# Patient Record
Sex: Male | Born: 1970 | Race: White | Hispanic: No | Marital: Married | Smoking: Former smoker
Health system: Southern US, Community
[De-identification: ages and names within clinical notes are randomized; demographics above are authoritative.]

## PROBLEM LIST (undated history)

## (undated) DIAGNOSIS — F411 Generalized anxiety disorder: Secondary | ICD-10-CM

## (undated) DIAGNOSIS — R51 Headache: Secondary | ICD-10-CM

## (undated) DIAGNOSIS — C801 Malignant (primary) neoplasm, unspecified: Secondary | ICD-10-CM

## (undated) DIAGNOSIS — Z87442 Personal history of urinary calculi: Secondary | ICD-10-CM

## (undated) DIAGNOSIS — M549 Dorsalgia, unspecified: Secondary | ICD-10-CM

## (undated) HISTORY — DX: Personal history of urinary calculi: Z87.442

## (undated) HISTORY — DX: Dorsalgia, unspecified: M54.9

## (undated) HISTORY — PX: PROSTATE BIOPSY: SHX241

## (undated) HISTORY — PX: HERNIA REPAIR: SHX51

## (undated) HISTORY — DX: Headache: R51

## (undated) HISTORY — DX: Generalized anxiety disorder: F41.1

---

## 1998-11-23 ENCOUNTER — Emergency Department (HOSPITAL_COMMUNITY): Admission: EM | Admit: 1998-11-23 | Discharge: 1998-11-23 | Payer: Self-pay | Admitting: Emergency Medicine

## 2002-03-10 ENCOUNTER — Encounter: Payer: Self-pay | Admitting: Emergency Medicine

## 2002-03-10 ENCOUNTER — Emergency Department (HOSPITAL_COMMUNITY): Admission: EM | Admit: 2002-03-10 | Discharge: 2002-03-10 | Payer: Self-pay | Admitting: Emergency Medicine

## 2003-08-15 ENCOUNTER — Emergency Department (HOSPITAL_COMMUNITY): Admission: EM | Admit: 2003-08-15 | Discharge: 2003-08-15 | Payer: Self-pay | Admitting: Emergency Medicine

## 2004-11-05 ENCOUNTER — Ambulatory Visit: Payer: Self-pay | Admitting: Internal Medicine

## 2005-08-06 ENCOUNTER — Ambulatory Visit: Payer: Self-pay | Admitting: Internal Medicine

## 2005-12-10 ENCOUNTER — Ambulatory Visit: Payer: Self-pay | Admitting: Internal Medicine

## 2005-12-11 ENCOUNTER — Ambulatory Visit: Payer: Self-pay | Admitting: Gastroenterology

## 2006-08-17 ENCOUNTER — Ambulatory Visit: Payer: Self-pay | Admitting: Internal Medicine

## 2007-03-31 ENCOUNTER — Telehealth: Payer: Self-pay | Admitting: Internal Medicine

## 2007-07-15 ENCOUNTER — Ambulatory Visit: Payer: Self-pay | Admitting: Internal Medicine

## 2007-07-15 DIAGNOSIS — R51 Headache: Secondary | ICD-10-CM | POA: Insufficient documentation

## 2007-07-15 DIAGNOSIS — F411 Generalized anxiety disorder: Secondary | ICD-10-CM

## 2007-07-15 DIAGNOSIS — Z87442 Personal history of urinary calculi: Secondary | ICD-10-CM

## 2007-07-15 DIAGNOSIS — R519 Headache, unspecified: Secondary | ICD-10-CM | POA: Insufficient documentation

## 2007-07-15 HISTORY — DX: Generalized anxiety disorder: F41.1

## 2007-07-15 HISTORY — DX: Personal history of urinary calculi: Z87.442

## 2007-07-15 HISTORY — DX: Headache: R51

## 2008-07-25 ENCOUNTER — Emergency Department (HOSPITAL_COMMUNITY): Admission: EM | Admit: 2008-07-25 | Discharge: 2008-07-25 | Payer: Self-pay | Admitting: Family Medicine

## 2009-10-14 ENCOUNTER — Ambulatory Visit: Payer: Self-pay | Admitting: Internal Medicine

## 2009-11-26 ENCOUNTER — Ambulatory Visit: Payer: Self-pay | Admitting: Family Medicine

## 2010-01-07 ENCOUNTER — Ambulatory Visit: Payer: Self-pay | Admitting: Internal Medicine

## 2010-01-10 ENCOUNTER — Ambulatory Visit (HOSPITAL_COMMUNITY): Admission: RE | Admit: 2010-01-10 | Discharge: 2010-01-10 | Payer: Self-pay | Admitting: Family Medicine

## 2010-02-14 ENCOUNTER — Ambulatory Visit: Payer: Self-pay | Admitting: Adult Health

## 2010-02-17 ENCOUNTER — Encounter (INDEPENDENT_AMBULATORY_CARE_PROVIDER_SITE_OTHER): Payer: Self-pay | Admitting: Adult Health

## 2010-02-17 LAB — CONVERTED CEMR LAB
Barbiturate Quant, Ur: NEGATIVE
Benzodiazepines.: NEGATIVE
Cocaine Metabolites: NEGATIVE
Methadone: NEGATIVE
Phencyclidine (PCP): NEGATIVE
Propoxyphene: NEGATIVE

## 2010-06-27 ENCOUNTER — Encounter: Payer: Self-pay | Admitting: Internal Medicine

## 2010-07-04 ENCOUNTER — Ambulatory Visit: Payer: Self-pay | Admitting: Internal Medicine

## 2010-07-10 ENCOUNTER — Encounter: Payer: Self-pay | Admitting: Internal Medicine

## 2010-07-10 DIAGNOSIS — M549 Dorsalgia, unspecified: Secondary | ICD-10-CM

## 2010-07-10 HISTORY — DX: Dorsalgia, unspecified: M54.9

## 2010-07-11 ENCOUNTER — Encounter: Payer: Self-pay | Admitting: Internal Medicine

## 2010-07-29 ENCOUNTER — Telehealth: Payer: Self-pay | Admitting: Internal Medicine

## 2010-08-21 NOTE — Letter (Signed)
Summary: Patient Information form  Patient Information form   Imported By: Maryln Gottron 07/25/2010 09:14:43  _____________________________________________________________________  External Attachment:    Type:   Image     Comment:   External Document

## 2010-08-21 NOTE — Assessment & Plan Note (Signed)
Summary: cpx---fast 8 hours//ccm pt rsc/njr   Vital Signs:  Patient profile:   40 year old male Height:      71 inches Weight:      212 pounds BMI:     29.67 Temp:     98.7 degrees F oral BP sitting:   110 / 70  (right arm) Cuff size:   regular  Vitals Entered By: Duard Brady LPN (July 10, 2010 2:51 PM) CC: c/o back pain , stomach bloating Is Patient Diabetic? No   CC:  c/o back pain  and stomach bloating.  History of Present Illness:  40 year old patient who is seen today after a 3-year absence.  He has a history of chronic low back pain, as well as anxiety disorder.  He lost his job a few years ago, and until recently was followed at [facility] .   presently, he is having low back pain, as well as episodic anxiety.  He is now unemployed and has had health insurance as of the beginning of this month  Preventive Screening-Counseling & Management  Alcohol-Tobacco     Smoking Status: never  Allergies (verified): No Known Drug Allergies  Past History:  Past Medical History: Anxiety Headache Nephrolithiasis, hx of low back pain  Past Surgical History: Reviewed history from 07/15/2007 and no changes required. Inguinal herniorrhaphy  age 49  Family History: Reviewed history from 07/15/2007 and no changes required. both parents are in their mid 57s in good health  Two sisters also in good health.  No family history of cancer or premature heart disease  Social History: Reviewed history from 07/15/2007 and no changes required. 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  The patient denies anorexia, fever, weight loss, decreased hearing, hoarseness, chest pain, syncope, dyspnea on exertion, peripheral edema, prolonged cough, headaches, hemoptysis, abdominal pain, melena, hematochezia, severe indigestion/heartburn, hematuria, incontinence, genital sores,  muscle weakness, suspicious skin lesions, transient blindness, difficulty walking, depression, unusual weight change, abnormal bleeding, enlarged lymph nodes, angioedema, breast masses, and testicular masses.    Physical Exam  General:  overweight-appearing.  low-normal blood pressureoverweight-appearing.   Head:  Normocephalic and atraumatic without obvious abnormalities. No apparent alopecia or balding. Eyes:  No corneal or conjunctival inflammation noted. EOMI. Perrla. Funduscopic exam benign, without hemorrhages, exudates or papilledema. Vision grossly normal. Mouth:  Oral mucosa and oropharynx without lesions or exudates.  Teeth in good repair. Neck:  No deformities, masses, or tenderness noted. Chest Wall:  No deformities, masses, tenderness or gynecomastia noted. Lungs:  Normal respiratory effort, chest expands symmetrically. Lungs are clear to auscultation, no crackles or wheezes. Heart:  Normal rate and regular rhythm. S1 and S2 normal without gallop, murmur, click, rub or other extra sounds. Abdomen:  Bowel sounds positive,abdomen soft and non-tender without masses, organomegaly or hernias noted. Msk:  No deformity or scoliosis noted of thoracic or lumbar spine.    negative straight leg test Pulses:  R and L carotid,radial,femoral,dorsalis pedis and posterior tibial pulses are full and equal bilaterally Extremities:  No clubbing, cyanosis, edema, or deformity noted with normal full range of motion of all joints.   Neurologic:  alert & oriented X3, cranial nerves II-XII intact, gait normal, and DTRs symmetrical and normal.    no motor weaknessalert & oriented X3, cranial nerves II-XII intact, gait normal, and DTRs symmetrical and normal.   Skin:  Intact without suspicious  lesions or rashes Cervical Nodes:  No lymphadenopathy noted Axillary Nodes:  No palpable lymphadenopathy Inguinal Nodes:  No significant adenopathy Psych:  Cognition and judgment appear intact. Alert and  cooperative with normal attention span and concentration. No apparent delusions, illusions, hallucinations   Impression & Recommendations:  Problem # 1:  Preventive Health Care (ICD-V70.0)  Orders: Venipuncture (16109) T- * Misc. Laboratory test [phone])  Complete Medication List: 1)  Vicodin 5-500 Mg Tabs (Hydrocodone-acetaminophen) .Marland Kitchen.. 1 q6h as needed 2)  Alprazolam Xr 0.5 Mg Tb24 (Alprazolam) .... One twice daily as needed  Other Orders: Depo- Medrol 80mg  (J1040) Admin of Therapeutic Inj  intramuscular or subcutaneous (09811)  Patient Instructions: 1)  Please schedule a follow-up appointment in 6 months. 2)  Limit your Sodium (Salt). 3)  It is important that you exercise regularly at least 20 minutes 5 times a week. If you develop chest pain, have severe difficulty breathing, or feel very tired , stop exercising immediately and seek medical attention. 4)  You need to lose weight. Consider a lower calorie diet and regular exercise.  5)  Take 400-600mg  of Ibuprofen (Advil, Motrin) with food every 4-6 hours as needed for relief of pain or comfort of fever. Prescriptions: ALPRAZOLAM XR 0.5 MG  TB24 (ALPRAZOLAM) one twice daily as needed  #50 x 4   Entered and Authorized by:   Gordy Savers  MD   Signed by:   Gordy Savers  MD on 07/10/2010   Method used:   Print then Give to Patient   RxID:   [id number] VICODIN 5-500 MG  TABS (HYDROCODONE-ACETAMINOPHEN) 1 q6h as needed  #50 x 2   Entered and Authorized by:   Gordy Savers  MD   Signed by:   Gordy Savers  MD on 07/10/2010   Method used:   Print then Give to Patient   RxID:   [id number]    Medication Administration  Injection # 1:    Medication: Depo- Medrol 80mg     Diagnosis: BACK PAIN, RIGHT (ICD-724.5)    Route: IM    Site: RUOQ gluteus    Exp Date: 10/18/2012    Lot #: [id number]    Mfr: Pharmacia    Patient tolerated injection without complications    Given by: Romualdo Bolk,  CMA (AAMA) (July 10, 2010 3:46 PM)  Orders Added: 1)  Est. Patient 40-64 years [99396] 2)  Est. Patient 40-64 years [99396] 3)  Venipuncture [40102] 4)  T- * Misc. Laboratory test [phone] 5)  Depo- Medrol 80mg  [J1040] 6)  Admin of Therapeutic Inj  intramuscular or subcutaneous [64403]

## 2010-08-21 NOTE — Progress Notes (Signed)
Summary: lab results  Phone Note Call from Patient Call back at Home Phone [phone] Call back at [phone]   Caller: Patient Call For: Gordy Savers  MD Summary of Call: pt needs varicella results Initial call taken by: Heron Sabins,  July 29, 2010 12:36 PM  Follow-up for Phone Call        please mail copy and notify positive (immunity ) Follow-up by: Gordy Savers  MD,  July 29, 2010 1:11 PM  Additional Follow-up for Phone Call Additional follow up Details #1::        hm# does not accept incoming calls - called other 3listed in msg - VM - LMTCB - test positive for immunity - call back with address if I need to mail a copy of results. KIK Additional Follow-up by: Duard Brady LPN,  July 29, 2010 1:34 PM

## 2010-08-21 NOTE — Miscellaneous (Signed)
Summary: cintrolled substance contract  Medications Added VICODIN 5-500 MG  TABS (HYDROCODONE-ACETAMINOPHEN) 1 q6h as needed ALPRAZOLAM XR 0.5 MG  TB24 (ALPRAZOLAM) one twice daily as needed       Clinical Lists Changes  Medications: Changed medication from VICODIN 5-500 MG  TABS (HYDROCODONE-ACETAMINOPHEN) 1 q6h as needed to VICODIN 5-500 MG  TABS (HYDROCODONE-ACETAMINOPHEN) 1 q6h as needed Changed medication from ALPRAZOLAM XR 0.5 MG  TB24 (ALPRAZOLAM) one twice daily as needed to ALPRAZOLAM XR 0.5 MG  TB24 (ALPRAZOLAM) one twice daily as needed

## 2010-08-21 NOTE — Miscellaneous (Signed)
Summary: Controlled Substances Contract  Controlled Substances Contract   Imported By: Maryln Gottron 07/17/2010 14:21:49  _____________________________________________________________________  External Attachment:    Type:   Image     Comment:   External Document

## 2010-08-21 NOTE — Letter (Signed)
Summary: [facility]  [facility]   Imported By: Maryln Gottron 08/08/2010 13:23:49  _____________________________________________________________________  External Attachment:    Type:   Image     Comment:   External Document

## 2010-11-03 LAB — COMPREHENSIVE METABOLIC PANEL
ALT: 42 U/L (ref 0–53)
AST: 26 U/L (ref 0–37)
Albumin: 4.2 g/dL (ref 3.5–5.2)
Alkaline Phosphatase: 75 U/L (ref 39–117)
CO2: 28 mEq/L (ref 19–32)
Chloride: 104 mEq/L (ref 96–112)
Creatinine, Ser: 1.06 mg/dL (ref 0.4–1.5)
GFR calc Af Amer: >60 mL/min (ref >60)
Glucose, Bld: 104 mg/dL — ABNORMAL HIGH (ref 70–99)
Potassium: 3.9 mEq/L (ref 3.5–5.1)
Sodium: 139 mEq/L (ref 135–145)
Total Protein: 6.9 g/dL (ref 6.0–8.3)

## 2010-11-03 LAB — CBC
Hemoglobin: 15.5 g/dL (ref 13.0–17.0)
WBC: 5.9 10*3/uL (ref 4.0–10.5)

## 2010-11-03 LAB — DIFFERENTIAL
Basophils Absolute: 0.1 10*3/uL (ref 0.0–0.1)
Monocytes Absolute: 0.3 10*3/uL (ref 0.1–1.0)
Neutro Abs: 3.8 10*3/uL (ref 1.7–7.7)

## 2010-11-03 LAB — LIPASE, BLOOD: Lipase: 51 U/L (ref 11–59)

## 2011-01-07 ENCOUNTER — Encounter: Payer: Self-pay | Admitting: Internal Medicine

## 2011-01-08 ENCOUNTER — Ambulatory Visit (INDEPENDENT_AMBULATORY_CARE_PROVIDER_SITE_OTHER): Payer: BC Managed Care – PPO | Admitting: Internal Medicine

## 2011-01-08 ENCOUNTER — Encounter: Payer: Self-pay | Admitting: Internal Medicine

## 2011-01-08 DIAGNOSIS — M549 Dorsalgia, unspecified: Secondary | ICD-10-CM

## 2011-01-08 DIAGNOSIS — M545 Low back pain: Secondary | ICD-10-CM

## 2011-01-08 DIAGNOSIS — R1030 Lower abdominal pain, unspecified: Secondary | ICD-10-CM

## 2011-01-08 DIAGNOSIS — R109 Unspecified abdominal pain: Secondary | ICD-10-CM

## 2011-01-08 DIAGNOSIS — Z87442 Personal history of urinary calculi: Secondary | ICD-10-CM

## 2011-01-08 LAB — POCT URINALYSIS DIPSTICK
Bilirubin, UA: NEGATIVE
Glucose, UA: NEGATIVE

## 2011-01-08 NOTE — Progress Notes (Signed)
  Subjective:    Patient ID: Justin Norris, male    DOB: [birth year], 40 y.o.   MRN: [id number]  HPI  40 year old patient who has a history of both nephrolithiasis and also chronic back pain. He presents today complaining of worsening pain mainly in the right midabdominal area this is associated with the sense of bloating. He also describes discomfort in the right flank region. He describes some slight decreased urinary stream. Denies any frank gross hematuria. He has been on narcotic analgesics in the past. He states he is not taking much the way of pain medications and he does not answer any refills today   Review of Systems  Constitutional: Negative for fever, chills, appetite change and fatigue.  HENT: Negative for hearing loss, ear pain, congestion, sore throat, trouble swallowing, neck stiffness, dental problem, voice change and tinnitus.   Eyes: Negative for pain, discharge and visual disturbance.  Respiratory: Negative for cough, chest tightness, wheezing and stridor.   Cardiovascular: Negative for chest pain, palpitations and leg swelling.  Gastrointestinal: Positive for abdominal pain and abdominal distention. Negative for nausea, vomiting, diarrhea, constipation and blood in stool.  Genitourinary: Positive for decreased urine volume. Negative for urgency, hematuria, flank pain, discharge, difficulty urinating and genital sores.  Musculoskeletal: Negative for myalgias, back pain, joint swelling, arthralgias and gait problem.  Skin: Negative for rash.  Neurological: Negative for dizziness, syncope, speech difficulty, weakness, numbness and headaches.  Hematological: Negative for adenopathy. Does not bruise/bleed easily.  Psychiatric/Behavioral: Negative for behavioral problems and dysphoric mood. The patient is not nervous/anxious.        Objective:   Physical Exam  Constitutional: He is oriented to person, place, and time. He appears well-developed and well-nourished. No distress.    HENT:  Head: Normocephalic.  Right Ear: External ear normal.  Left Ear: External ear normal.  Eyes: Conjunctivae and EOM are normal.  Neck: Normal range of motion.  Cardiovascular: Normal rate and normal heart sounds.   Pulmonary/Chest: Breath sounds normal.  Abdominal: Soft. Bowel sounds are normal. He exhibits no distension and no mass. There is no tenderness. There is no rebound and no guarding.       Mildly obese soft nontender  Musculoskeletal: Normal range of motion. He exhibits no edema and no tenderness.  Neurological: He is alert and oriented to person, place, and time.  Psychiatric: He has a normal mood and affect. His behavior is normal.          Assessment & Plan:  Abdominal and back pain history of nephrolithiasis. Trace hematuria. Will obtain a CT urogram to rule out renal stone hydro-nephrosis

## 2011-01-08 NOTE — Patient Instructions (Signed)
CT abdomen as discussed push fluids  Call or return to clinic prn if these symptoms worsen or fail to improve as anticipated.

## 2011-01-09 ENCOUNTER — Other Ambulatory Visit: Payer: Self-pay

## 2011-01-09 MED ORDER — ALPRAZOLAM ER 0.5 MG PO TB24: 0.5000 mg | ORAL_TABLET | Freq: Two times a day (BID) | ORAL | Status: DC | PRN

## 2011-01-09 NOTE — Telephone Encounter (Signed)
Faxed back to [facility].

## 2011-01-14 ENCOUNTER — Ambulatory Visit (INDEPENDENT_AMBULATORY_CARE_PROVIDER_SITE_OTHER)
Admission: RE | Admit: 2011-01-14 | Discharge: 2011-01-14 | Disposition: A | Discharge status: Home / Self Care | Payer: BC Managed Care – PPO | Source: Ambulatory Visit | Attending: Internal Medicine | Admitting: Internal Medicine

## 2011-01-14 DIAGNOSIS — Z87442 Personal history of urinary calculi: Secondary | ICD-10-CM

## 2011-01-14 DIAGNOSIS — M549 Dorsalgia, unspecified: Secondary | ICD-10-CM

## 2011-01-15 ENCOUNTER — Telehealth: Payer: Self-pay

## 2011-01-15 MED ORDER — TRAMADOL HCL 50 MG PO TABS: 50.0000 mg | ORAL_TABLET | Freq: Four times a day (QID) | ORAL | Status: DC | PRN

## 2011-01-15 NOTE — Telephone Encounter (Signed)
Pt aware of this. Rx sent to pharmacy.

## 2011-01-15 NOTE — Telephone Encounter (Signed)
spke with pt - informed of xray normal - ned. , pt still with moderate pain - what next ?

## 2011-01-15 NOTE — Telephone Encounter (Signed)
Please call in a prescription of tramadol 50 mg #90 for pain 1 every 6 hours when necessary. 2 refills

## 2011-01-15 NOTE — Progress Notes (Signed)
Quick Note:  Spoke with pt -informed of xray results - normal - Pt states still having pain - what next? ______

## 2011-04-06 ENCOUNTER — Ambulatory Visit (INDEPENDENT_AMBULATORY_CARE_PROVIDER_SITE_OTHER): Payer: BC Managed Care – PPO | Admitting: Internal Medicine

## 2011-04-06 ENCOUNTER — Encounter: Payer: Self-pay | Admitting: Internal Medicine

## 2011-04-06 DIAGNOSIS — L509 Urticaria, unspecified: Secondary | ICD-10-CM

## 2011-04-06 MED ORDER — METHYLPREDNISOLONE ACETATE 40 MG/ML IJ SUSP
40.0000 mg | Freq: Once | INTRAMUSCULAR | Status: AC
  Administered 2011-04-06: 40 mg via INTRAMUSCULAR

## 2011-04-06 MED ORDER — METHYLPREDNISOLONE ACETATE 80 MG/ML IJ SUSP
80.0000 mg | Freq: Once | INTRAMUSCULAR | Status: AC
  Administered 2011-04-06: 80 mg via INTRAMUSCULAR

## 2011-04-06 NOTE — Progress Notes (Signed)
Addended by: Duard Brady I on: 04/06/2011 11:56 AM   Modules accepted: Orders

## 2011-04-06 NOTE — Patient Instructions (Signed)
Consider a once a day  nonsedating antihistamine such as Alavert or Allegra  Call or return to clinic prn if these symptoms worsen or fail to improve as anticipated.

## 2011-04-06 NOTE — Progress Notes (Signed)
  Subjective:    Patient ID: Justin Norris, male    DOB: [birth year], 40 y.o.   MRN: [id number]  HPI  40 year old patient who presents with a couple day history of fairly generalized urticaria. This spares the face but the involved extremity and trunk. He has been traveling out of town and has had some possible exposure here locally as well. When traveling out of state he did not have his travel back and was using until silken shampoos etc. no prior history of hives. No pulmonary symptoms.   Review of Systems  Skin: Positive for rash.       Objective:   Physical Exam  Constitutional: He appears well-developed and well-nourished. No distress.  Pulmonary/Chest: Effort normal and breath sounds normal. No respiratory distress. He has no wheezes.  Skin: Rash noted.       Rather diffuse urticarial lesions on extremities and trunk          Assessment & Plan:   Urticaria. Will treat with Depo-Medrol. A nonsedating antihistamines also encouraged as well as Benadryl at bedtime. Will call if not improved

## 2011-04-17 ENCOUNTER — Telehealth: Payer: Self-pay | Admitting: *Deleted

## 2011-04-17 NOTE — Telephone Encounter (Addendum)
[place]  Suggested to get OTC antihistamine, and gave him signs that he should look for if severe allergic reaction.

## 2011-04-20 NOTE — Telephone Encounter (Signed)
Dr. K aware. 

## 2011-05-22 IMAGING — CT CT PARANASAL SINUSES LIMITED
4 of 5 series · 14 of 33 positions shown, 16 images · non-contrast
Comparison: None.

CLINICAL DATA: Right sided facial pain

CT PARANASAL SINUS LIMITED WITHOUT CONTRAST
TECHNIQUE: Multidetector CT images of the paranasal sinuses were
obtained in a single plane without contrast.

[Series 2: neck st · axial · 0.47mm/px · z∈[-229,-85]mm · 3 of 96 slices shown]
[im 24/96  bone]
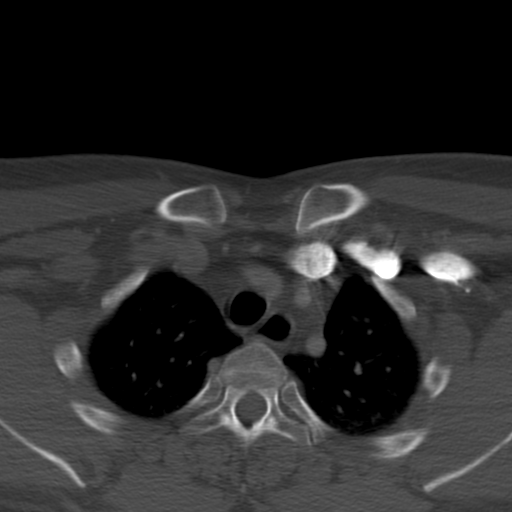
[im 48/96  bone]
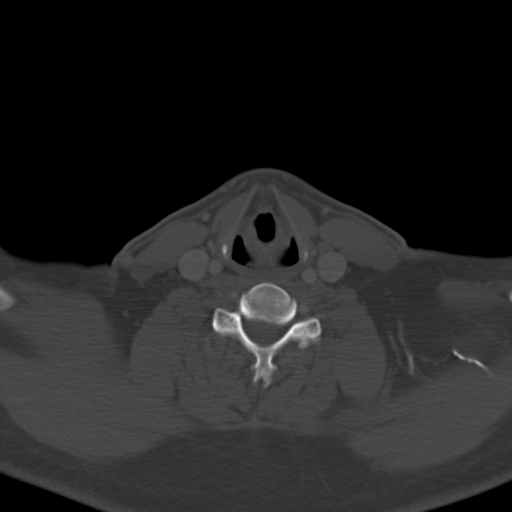
[im 72/96  bone]
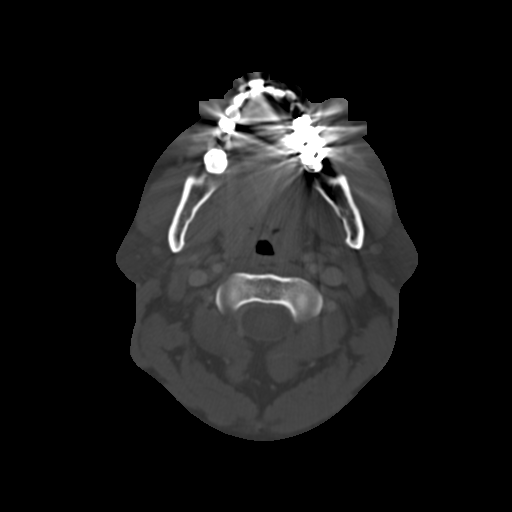

[Series 603: <mpr thick range(1)> · sagittal · 0.56mm/px · 3 of 80 slices shown]
[im 33/80  bone]
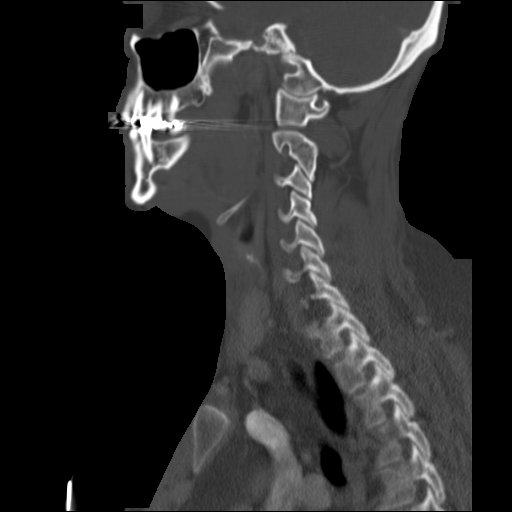
[im 40/80  bone]
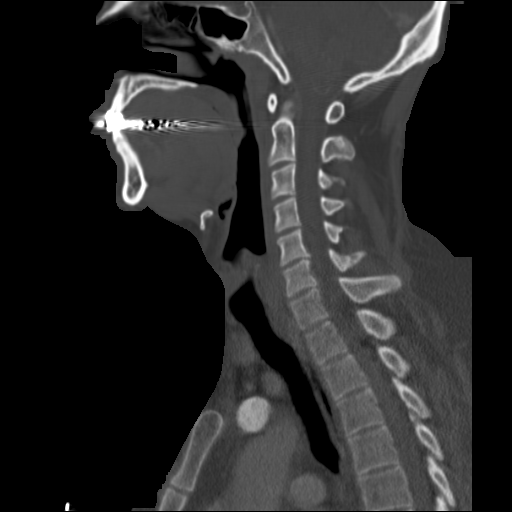
[im 47/80  bone]
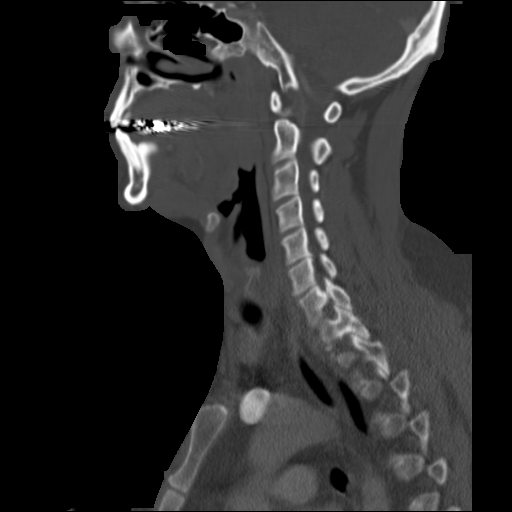

[Series 604: <mpr thick range(2)> · coronal · 0.56mm/px · 1 of 81 slices shown]
[im 41/81  bone]
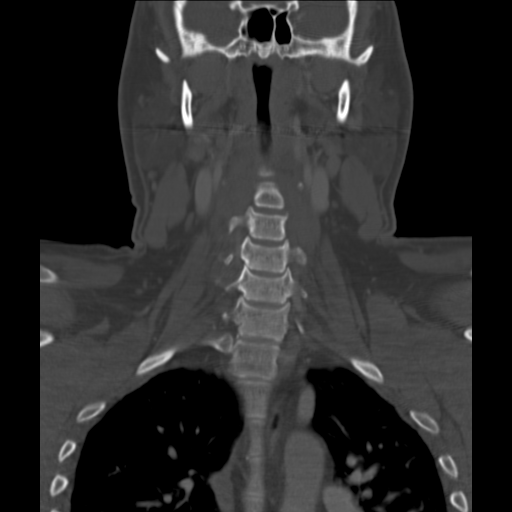

[Series 605: <mpr thick range(3)> · axial · 0.56mm/px · z∈[-327,-89]mm · 7 of 168 slices shown, 9 images]
[im 21/168  soft-tissue]
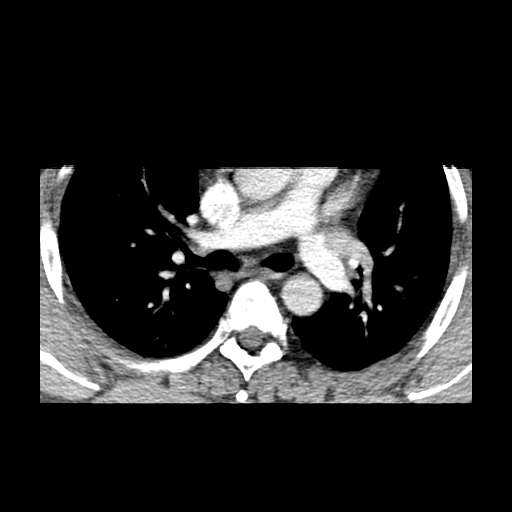
[im 21/168  bone]
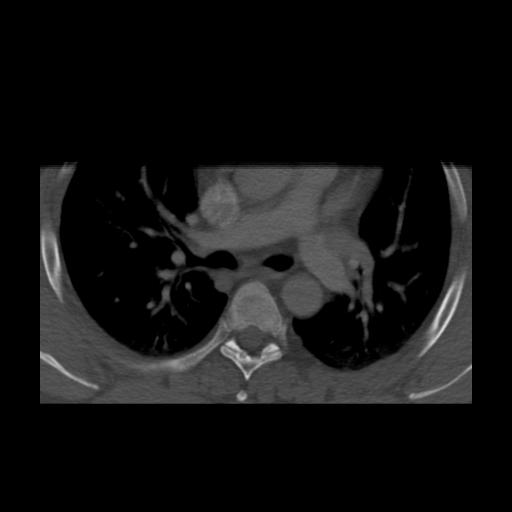
[im 42/168  bone]
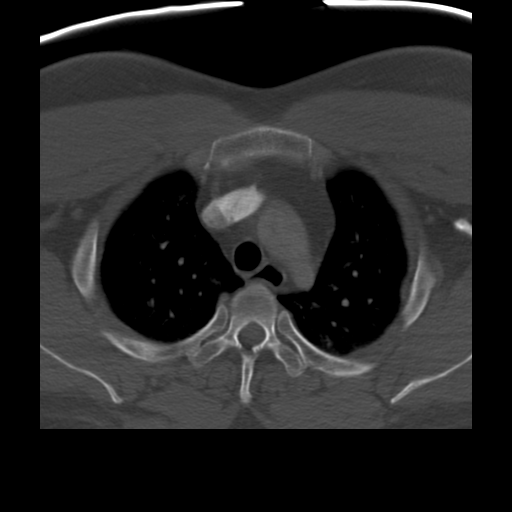
[im 63/168  bone]
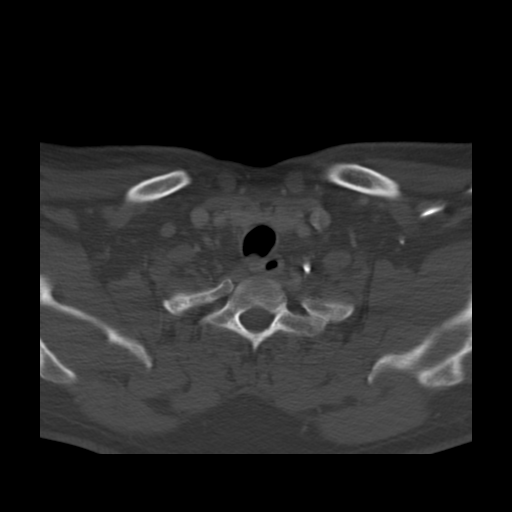
[im 84/168  bone]
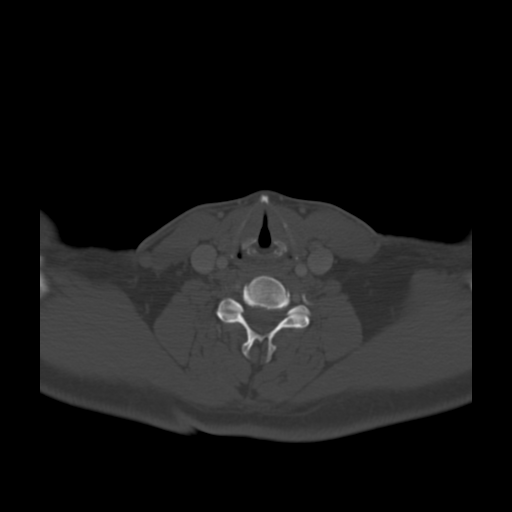
[im 105/168  soft-tissue]
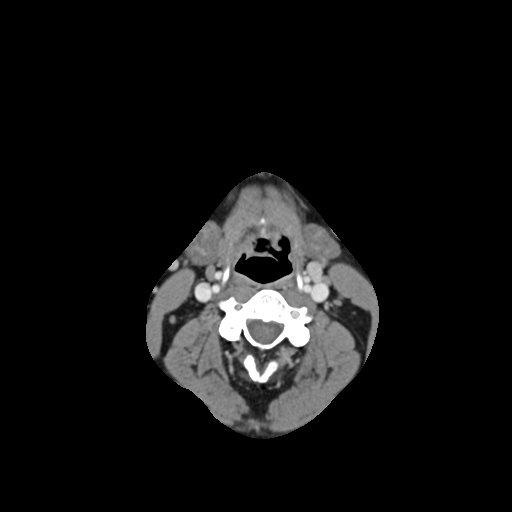
[im 105/168  bone]
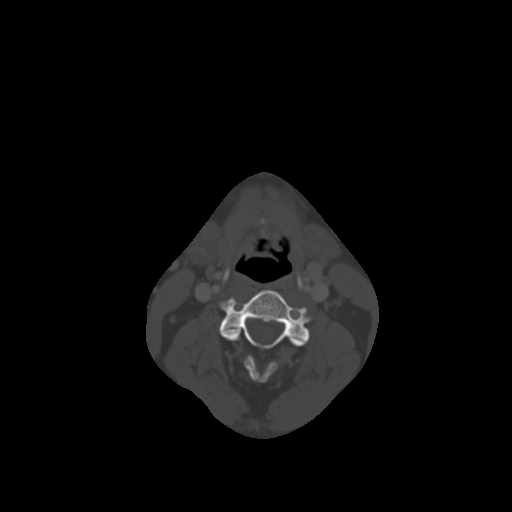
[im 126/168  bone]
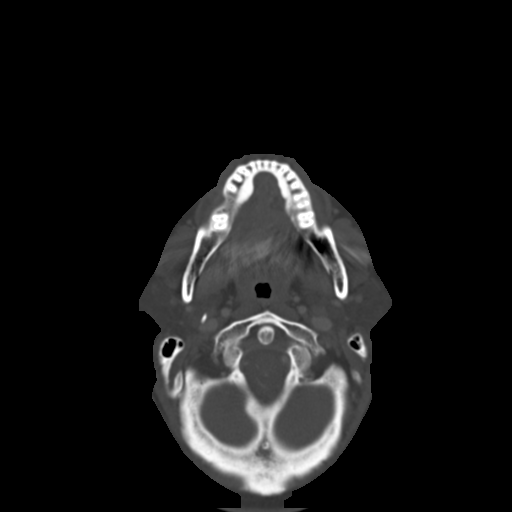
[im 147/168  bone]
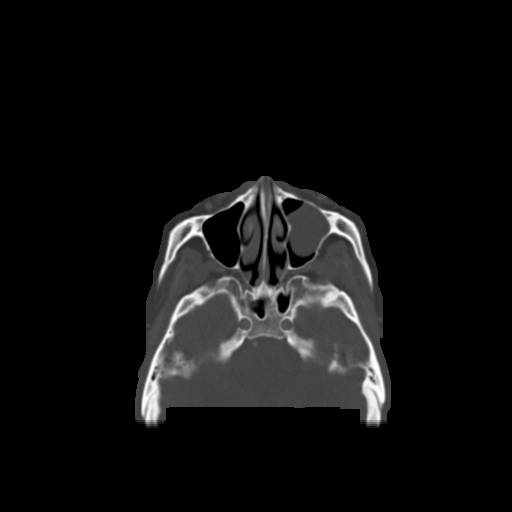

[14 of 33 positions shown; findings below may reference images not displayed]

FINDINGS: There is a large retention cyst within the left maxillary
sinus.  However no air-fluid level is seen within any of the
paranasal sinuses and no significant mucosal thickening is seen.
The right nasal airway is patent.  The left nasal airway is
slightly compromised by prominent turbinates.  No bony abnormality
is seen.
IMPRESSION: No definite sinusitis.  There is a large retention cyst however
filling much of the left maxillary sinus.  Slightly compromised
left nasal airway as noted above.

## 2011-06-16 ENCOUNTER — Other Ambulatory Visit: Payer: Self-pay | Admitting: Internal Medicine

## 2011-06-16 NOTE — Telephone Encounter (Signed)
Refill Alprazolam---[facility]  [address]. thanks

## 2011-06-17 MED ORDER — ALPRAZOLAM ER 0.5 MG PO TB24: 0.5000 mg | ORAL_TABLET | Freq: Two times a day (BID) | ORAL | Status: DC | PRN

## 2011-10-02 ENCOUNTER — Encounter: Payer: Self-pay | Admitting: Internal Medicine

## 2011-10-02 ENCOUNTER — Ambulatory Visit (INDEPENDENT_AMBULATORY_CARE_PROVIDER_SITE_OTHER): Payer: BC Managed Care – PPO | Admitting: Internal Medicine

## 2011-10-02 VITALS — BP 130/80 | Wt 212.0 lb

## 2011-10-02 DIAGNOSIS — M549 Dorsalgia, unspecified: Secondary | ICD-10-CM

## 2011-10-02 MED ORDER — ALPRAZOLAM ER 0.5 MG PO TB24: 0.5000 mg | ORAL_TABLET | Freq: Two times a day (BID) | ORAL | Status: DC | PRN

## 2011-10-02 NOTE — Progress Notes (Signed)
  Subjective:    Patient ID: Justin Norris, male    DOB: 1971/01/26, 41 y.o.   MRN: [id number]  HPI A 41 year old patient who is seen today complaining of right back pain with some radiation down the right leg. This has been an intermittent problem over the years he usually responds quite nicely to a cortisone injection. He has had no injections over the past 6 months. No motor weakness.   Review of Systems  Musculoskeletal: Positive for back pain.       Objective:   Physical Exam  Musculoskeletal: Normal range of motion. He exhibits no edema and no tenderness.       Negative straight leg test Motor examination normal Normal flexion and extension of the ankle Achilles reflexes blunted but symmetrical          Assessment & Plan:   Flare low back pain. Will treat with Depo-Medrol. We'll continue anti-inflammatories rest. Will call if unimproved

## 2011-10-02 NOTE — Patient Instructions (Signed)
Most patients with low back pain will improve with time over the next two to 6 weeks.  Keep active but avoid any activities that cause pain.  Apply moist heat to the low back area several times daily.  Call or return to clinic prn if these symptoms worsen or fail to improve as anticipated.  

## 2011-10-13 ENCOUNTER — Telehealth: Payer: Self-pay

## 2011-10-13 NOTE — Telephone Encounter (Signed)
Pt states he was seen on last week for lower back pain.  This week pt is experiencing pains behind his belly button that feel like "cracked glass".  Pt states if he touches the 2 inch circle around his belly button it is very painful.  Pt is also experiencing nausea, sweats, and feels a little feverish.  Pt states he only had 1 meal on yesterday and he feels "blocked".  Per pt it is getting difficult to end over to pick up things.  Pt states he does not believe this is gas or constipation.  Pt has a BM every day but describes it as slow, hard pebbles the size of a nickel..  Pt is currently out of town working and will not be back until Thursday.  Pls advise.

## 2011-10-13 NOTE — Telephone Encounter (Signed)
Suggest urgent care for pain worsens while out of town. Otherwise we will see on Thursday. Please schedule appointment

## 2011-10-14 NOTE — Telephone Encounter (Signed)
Pt is aware.  Appt made for 10/12/11 at 4: 15 pm.

## 2011-10-15 ENCOUNTER — Ambulatory Visit: Payer: BC Managed Care – PPO | Admitting: Internal Medicine

## 2011-10-23 ENCOUNTER — Ambulatory Visit (INDEPENDENT_AMBULATORY_CARE_PROVIDER_SITE_OTHER): Payer: BC Managed Care – PPO | Admitting: Internal Medicine

## 2011-10-23 ENCOUNTER — Encounter: Payer: Self-pay | Admitting: Internal Medicine

## 2011-10-23 VITALS — BP 120/80 | Temp 98.0°F | Wt 209.0 lb

## 2011-10-23 DIAGNOSIS — K429 Umbilical hernia without obstruction or gangrene: Secondary | ICD-10-CM

## 2011-10-23 DIAGNOSIS — F411 Generalized anxiety disorder: Secondary | ICD-10-CM

## 2011-10-23 MED ORDER — ALPRAZOLAM 0.5 MG PO TABS: 0.5000 mg | ORAL_TABLET | Freq: Two times a day (BID) | ORAL | Status: DC | PRN

## 2011-10-23 MED ORDER — HYDROCODONE-ACETAMINOPHEN 5-500 MG PO TABS: 1.0000 | ORAL_TABLET | Freq: Four times a day (QID) | ORAL | Status: DC | PRN

## 2011-10-23 NOTE — Progress Notes (Signed)
  Subjective:    Patient ID: Justin Norris, male    DOB: 12-06-1970, 41 y.o.   MRN: [id number]  HPI  41 year old patient who presents with a several week history of intermittent periumbilical pain. At times he notes a bulge in the umbilical area that he is able to reduce he states that time pain is quite painful. His job does require intermittent heavy lifting that tends to aggravate the pain.  Wt Readings from Last 3 Encounters:  10/23/11 209 lb (94.802 kg)  10/02/11 212 lb (96.163 kg)  04/06/11 215 lb (97.523 kg)    Review of Systems  Gastrointestinal: Positive for abdominal pain.  Psychiatric/Behavioral: The patient is nervous/anxious.        Objective:   Physical Exam  Constitutional: He appears well-developed and well-nourished. No distress.  Abdominal:       No obvious umbilical hernia. However there does appear to be small defect in the umbilical region Palpation about the umbilical area revealed mild tenderness          Assessment & Plan:   Probable small umbilical hernia. He was advised to use an abdominal binder. He will call for a surgical referral if pain worsens.

## 2011-10-23 NOTE — Patient Instructions (Signed)
Use an abdominal binder as discussed  Call for surgical referral if  pain continues to be a problem

## 2011-10-29 ENCOUNTER — Telehealth: Payer: Self-pay | Admitting: *Deleted

## 2011-10-29 DIAGNOSIS — R1033 Periumbilical pain: Secondary | ICD-10-CM

## 2011-10-29 NOTE — Telephone Encounter (Signed)
Spoke with pt- dr. Amador Cunas has left info in last office note that if worse - surgical referral - I will put order in and sent to terri to schedule. Pt indicated because he works out of town - can only be seen on friday's

## 2011-10-29 NOTE — Telephone Encounter (Signed)
Pt is asking if we can move his appt up with the surgeon for his umbilical hernia.  He is having increasing pain after doing strenuous labor.

## 2011-11-23 ENCOUNTER — Ambulatory Visit (INDEPENDENT_AMBULATORY_CARE_PROVIDER_SITE_OTHER): Payer: BC Managed Care – PPO | Admitting: Surgery

## 2011-11-23 ENCOUNTER — Encounter (INDEPENDENT_AMBULATORY_CARE_PROVIDER_SITE_OTHER): Payer: Self-pay | Admitting: Surgery

## 2011-11-23 VITALS — BP 112/60 | HR 66 | Temp 96.4°F | Resp 12 | Ht 71.0 in | Wt 209.6 lb

## 2011-11-23 DIAGNOSIS — K429 Umbilical hernia without obstruction or gangrene: Secondary | ICD-10-CM | POA: Insufficient documentation

## 2011-11-23 DIAGNOSIS — K5900 Constipation, unspecified: Secondary | ICD-10-CM | POA: Insufficient documentation

## 2011-11-23 NOTE — Progress Notes (Signed)
Patient ID: Justin Norris, male   DOB: [birth year], 41 y.o.   MRN: [id number]  Chief Complaint  Patient presents with  . Abdominal Pain    new pt    HPI Justin Norris is a 41 y.o. male.   HPI Patient sent at the request of Dr. Amador Cunas due to abdominal pain. He has had it for one year. He describes the pain as sharp located run his umbilicus made worse with lifting or exertion. He also has some right lower quadrant and left lower quadrant pain and bloating. He does have some issues with constipation. He has changed jobs recently and does a lot of driving and eating fast food. He has been gaining weight. He drinks multiple caffeinated beverages. He also has pain in his lower back. As the blood in stool, nausea or vomiting.  Past Medical History  Diagnosis Date  . ANXIETY 07/15/2007  . BACK PAIN, RIGHT 07/10/2010  . Headache 07/15/2007  . NEPHROLITHIASIS, HX OF 07/15/2007    Past Surgical History  Procedure Date  . Hernia repair     [facility]    History reviewed. No pertinent family history.  Social History History  Substance Use Topics  . Smoking status: Never Smoker   . Smokeless tobacco: Never Used  . Alcohol Use: Yes     rarely    No Known Allergies  Current Outpatient Prescriptions  Medication Sig Dispense Refill  . ALPRAZolam (XANAX) 0.5 MG tablet Take 1 tablet (0.5 mg total) by mouth 2 (two) times daily as needed.  60 tablet  3    Review of Systems Review of Systems  Constitutional: Positive for unexpected weight change.  HENT: Negative.   Eyes: Negative.   Respiratory: Negative.   Cardiovascular: Negative.   Gastrointestinal: Positive for abdominal pain, constipation and abdominal distention.  Genitourinary: Negative.   Musculoskeletal: Negative.   Neurological: Negative.   Hematological: Negative.   Psychiatric/Behavioral: Negative.     Blood pressure 112/60, pulse 66, temperature 96.4 F (35.8 C), temperature source Temporal, resp. rate 12, height 5\' 11"   (1.803 m), weight 209 lb 9.6 oz (95.074 kg).  Physical Exam Physical Exam  Constitutional: He is oriented to person, place, and time. He appears well-developed and well-nourished.  HENT:  Head: Normocephalic and atraumatic.  Eyes: EOM are normal. Pupils are equal, round, and reactive to light.  Neck: Normal range of motion. Neck supple.  Cardiovascular: Normal rate and regular rhythm.   Pulmonary/Chest: Effort normal and breath sounds normal.  Abdominal: Soft. Bowel sounds are normal. He exhibits distension.    Musculoskeletal: Normal range of motion.  Neurological: He is alert and oriented to person, place, and time.  Skin: Skin is warm and dry.  Psychiatric: He has a normal mood and affect. His behavior is normal. Thought content normal.    Data Reviewed   Assessment    Umbilical hernia symptomatic  Constipation    Plan    I discussed options of repair of his umbilical hernia versus observation. I think is constipated and this is giving some abdominal discomfort. Recommend increase his water intake, increasing his dietary fiber and try MiraLax as needed to see if it helps her symptoms of abdominal pain. He will let me know if he wants his hernia repaired.       Nomie Buchberger A. 11/23/2011, 10:08 AM

## 2011-11-23 NOTE — Patient Instructions (Signed)
Hernia A hernia occurs when an internal organ pushes out through a weak spot in the abdominal wall. Hernias most commonly occur in the groin and around the navel. Hernias often can be pushed back into place (reduced). Most hernias tend to get worse over time. Some abdominal hernias can get stuck in the opening (irreducible or incarcerated hernia) and cannot be reduced. An irreducible abdominal hernia which is tightly squeezed into the opening is at risk for impaired blood supply (strangulated hernia). A strangulated hernia is a medical emergency. Because of the risk for an irreducible or strangulated hernia, surgery may be recommended to repair a hernia. CAUSES   Heavy lifting.   Prolonged coughing.   Straining to have a bowel movement.   A cut (incision) made during an abdominal surgery.  HOME CARE INSTRUCTIONS   Bed rest is not required. You may continue your normal activities.   Avoid lifting more than 10 pounds (4.5 kg) or straining.   Cough gently. If you are a smoker it is best to stop. Even the best hernia repair can break down with the continual strain of coughing. Even if you do not have your hernia repaired, a cough will continue to aggravate the problem.   Do not wear anything tight over your hernia. Do not try to keep it in with an outside bandage or truss. These can damage abdominal contents if they are trapped within the hernia sac.   Eat a normal diet.   Avoid constipation. Straining over long periods of time will increase hernia size and encourage breakdown of repairs. If you cannot do this with diet alone, stool softeners may be used.  SEEK IMMEDIATE MEDICAL CARE IF:   You have a fever.   You develop increasing abdominal pain.   You feel nauseous or vomit.   Your hernia is stuck outside the abdomen, looks discolored, feels hard, or is tender.   You have any changes in your bowel habits or in the hernia that are unusual for you.   You have increased pain or  swelling around the hernia.   You cannot push the hernia back in place by applying gentle pressure while lying down.  MAKE SURE YOU:   Understand these instructions.   Will watch your condition.   Will get help right away if you are not doing well or get worse.  Document Released: 07/06/2005 Document Revised: 06/25/2011 Document Reviewed: 02/23/2008 [facility] Patient Information 2012 [place], [place].        Constipation in Adults Constipation is having fewer than 2 bowel movements per week. Usually, the stools are hard. As we grow older, constipation is more common. If you try to fix constipation with laxatives, the problem may get worse. This is because laxatives taken over a long period of time make the colon muscles weaker. A low-fiber diet, not taking in enough fluids, and taking some medicines may make these problems worse. MEDICATIONS THAT MAY CAUSE CONSTIPATION  Water pills (diuretics).   Calcium channel blockers (used to control blood pressure and for the heart).   Certain pain medicines (narcotics).   Anticholinergics.   Anti-inflammatory agents.   Antacids that contain aluminum.  DISEASES THAT CONTRIBUTE TO CONSTIPATION  Diabetes.   Parkinson's disease.   Dementia.   Stroke.   Depression.   Illnesses that cause problems with salt and water metabolism.  HOME CARE INSTRUCTIONS   Constipation is usually best cared for without medicines. Increasing dietary fiber and eating more fruits and vegetables is the best way  to manage constipation.   Slowly increase fiber intake to 25 to 38 grams per day. Whole grains, fruits, vegetables, and legumes are good sources of fiber. A dietitian can further help you incorporate high-fiber foods into your diet.   Drink enough water and fluids to keep your urine clear or pale yellow.   A fiber supplement may be added to your diet if you cannot get enough fiber from foods.   Increasing your activities also helps improve  regularity.   Suppositories, as suggested by your caregiver, will also help. If you are using antacids, such as aluminum or calcium containing products, it will be helpful to switch to products containing magnesium if your caregiver says it is okay.   If you have been given a liquid injection (enema) today, this is only a temporary measure. It should not be relied on for treatment of longstanding (chronic) constipation.   Stronger measures, such as magnesium sulfate, should be avoided if possible. This may cause uncontrollable diarrhea. Using magnesium sulfate may not allow you time to make it to the bathroom.  SEEK IMMEDIATE MEDICAL CARE IF:   There is bright red blood in the stool.   The constipation stays for more than 4 days.   There is belly (abdominal) or rectal pain.   You do not seem to be getting better.   You have any questions or concerns.  MAKE SURE YOU:   Understand these instructions.   Will watch your condition.   Will get help right away if you are not doing well or get worse.  Document Released: 04/03/2004 Document Revised: 06/25/2011 Document Reviewed: 06/09/2011 [facility] Patient Information 2012 [place], [place].     Miralax 17 grams a day.  This is over the counter.  Citrucel fiber supplement daily.   64 ounces of water daily.

## 2012-02-09 ENCOUNTER — Encounter: Payer: Self-pay | Admitting: Internal Medicine

## 2012-02-09 ENCOUNTER — Ambulatory Visit (INDEPENDENT_AMBULATORY_CARE_PROVIDER_SITE_OTHER): Payer: BC Managed Care – PPO | Admitting: Internal Medicine

## 2012-02-09 VITALS — BP 116/80 | Temp 98.5°F | Wt 204.0 lb

## 2012-02-09 DIAGNOSIS — K429 Umbilical hernia without obstruction or gangrene: Secondary | ICD-10-CM

## 2012-02-09 DIAGNOSIS — F411 Generalized anxiety disorder: Secondary | ICD-10-CM

## 2012-02-09 MED ORDER — HYDROCODONE-HOMATROPINE 5-1.5 MG/5ML PO SYRP: 5.0000 mL | ORAL_SOLUTION | Freq: Four times a day (QID) | ORAL | Status: DC | PRN

## 2012-02-09 MED ORDER — SULFACETAMIDE SODIUM 10 % OP OINT: TOPICAL_OINTMENT | Freq: Four times a day (QID) | OPHTHALMIC | Status: AC

## 2012-02-09 NOTE — Patient Instructions (Signed)
Get plenty of rest, Drink lots of  clear liquids, and use Tylenol or ibuprofen for fever and discomfort.    

## 2012-02-09 NOTE — Progress Notes (Signed)
  Subjective:    Patient ID: Justin Norris, male    DOB: Nov 20, 1970, 41 y.o.   MRN: [id number]  HPI  41 year old patient who is followed by Gen. surgery for a small but symptomatic umbilical hernia. He presents today with the complaints of conjunctival injection as well as productive cough.    Review of Systems  Constitutional: Negative for fever, chills, appetite change and fatigue.  HENT: Negative for hearing loss, ear pain, congestion, sore throat, trouble swallowing, neck stiffness, dental problem, voice change and tinnitus.   Eyes: Positive for redness and itching. Negative for photophobia, pain, discharge and visual disturbance.  Respiratory: Positive for cough. Negative for chest tightness, wheezing and stridor.   Cardiovascular: Negative for chest pain, palpitations and leg swelling.  Gastrointestinal: Negative for nausea, vomiting, abdominal pain, diarrhea, constipation, blood in stool and abdominal distention.  Genitourinary: Negative for urgency, hematuria, flank pain, discharge, difficulty urinating and genital sores.  Musculoskeletal: Negative for myalgias, back pain, joint swelling, arthralgias and gait problem.  Skin: Negative for rash.  Neurological: Negative for dizziness, syncope, speech difficulty, weakness, numbness and headaches.  Hematological: Negative for adenopathy. Does not bruise/bleed easily.  Psychiatric/Behavioral: Negative for behavioral problems and dysphoric mood. The patient is not nervous/anxious.        Objective:   Physical Exam  Constitutional: He is oriented to person, place, and time. He appears well-developed.  HENT:  Head: Normocephalic.  Right Ear: External ear normal.  Left Ear: External ear normal.  Eyes: Conjunctivae and EOM are normal.       Bilateral conjunctival injection  Neck: Normal range of motion.  Cardiovascular: Normal rate and normal heart sounds.   Pulmonary/Chest: Breath sounds normal. No respiratory distress. He has no  wheezes. He has no rales.  Abdominal: Bowel sounds are normal.  Musculoskeletal: Normal range of motion. He exhibits no edema and no tenderness.  Neurological: He is alert and oriented to person, place, and time.  Psychiatric: He has a normal mood and affect. His behavior is normal.          Assessment & Plan:   Viral URI with conjunctivitis;  will treat with Bleph-10 as well as symptomatic treatment for cough. General surgery followup  Return here when necessary

## 2012-02-15 ENCOUNTER — Other Ambulatory Visit: Payer: Self-pay | Admitting: Internal Medicine

## 2012-02-15 ENCOUNTER — Telehealth: Payer: Self-pay | Admitting: Internal Medicine

## 2012-02-15 NOTE — Telephone Encounter (Signed)
Called in.

## 2012-02-15 NOTE — Telephone Encounter (Signed)
Please advise 

## 2012-02-15 NOTE — Telephone Encounter (Signed)
Just seen 02/09/12 for URI - was given at that time =please advise

## 2012-02-15 NOTE — Telephone Encounter (Signed)
6 oz 

## 2012-02-15 NOTE — Telephone Encounter (Signed)
Caller: Justin Norris/Patient; PCP: Eleonore Chiquito;; Call regarding seen in office on 02/09/12 and dx with Virus and prescribed Cough med.  He still has Cough/Congestion - left Rx in refrigerator of Hotel room on 02/13/12. He is wondering if another rx can be called in to [facility] in [place] [phone].Marland Kitchen He is coughing every 10 min and is coughing up greenish mucos, voice is hoarse. He is using Vicks Dayqill and helps with some of sx, but not with tickle in throat and need to cough; PLEASE CALL HIM AFTER MED CALLED IN OR HAVE PHARMACY CALL.  CB#: [phone]

## 2012-02-15 NOTE — Telephone Encounter (Signed)
This was called in earlier today.

## 2012-04-25 ENCOUNTER — Telehealth: Payer: Self-pay | Admitting: Gastroenterology

## 2012-04-26 NOTE — Telephone Encounter (Signed)
Pt is complaining of abdominal pain on his right lower abdomen. Pt has an umbilical hernia and wants to be seen by GI prior to having any type of surgical repair. Pt scheduled to see Willette Cluster NP 04/29/12@2pm . Pt aware of appt date and time.

## 2012-04-29 ENCOUNTER — Ambulatory Visit: Payer: BC Managed Care – PPO | Admitting: Nurse Practitioner

## 2012-05-11 ENCOUNTER — Telehealth: Payer: Self-pay | Admitting: Gastroenterology

## 2012-05-11 NOTE — Telephone Encounter (Signed)
Pt states he is having abdominal pain about 4 inches from his belly button. Pt has been diagnosed with an umbilical hernia and would like to be seen prior to having this repaired. Pt thinks there may be something else going on other than the hernia. Pt was scheduled earlier in the month but pt states he could not get off work to make the appt. Pt scheduled to see Willette Cluster NP Friday at 9:30am. Pt aware of appt date and time.

## 2012-05-13 ENCOUNTER — Ambulatory Visit (INDEPENDENT_AMBULATORY_CARE_PROVIDER_SITE_OTHER): Payer: BC Managed Care – PPO | Admitting: Nurse Practitioner

## 2012-05-13 ENCOUNTER — Other Ambulatory Visit (INDEPENDENT_AMBULATORY_CARE_PROVIDER_SITE_OTHER): Payer: BC Managed Care – PPO

## 2012-05-13 ENCOUNTER — Encounter: Payer: Self-pay | Admitting: Nurse Practitioner

## 2012-05-13 VITALS — BP 114/72 | HR 80 | Wt 215.0 lb

## 2012-05-13 DIAGNOSIS — R140 Abdominal distension (gaseous): Secondary | ICD-10-CM

## 2012-05-13 DIAGNOSIS — R109 Unspecified abdominal pain: Secondary | ICD-10-CM

## 2012-05-13 DIAGNOSIS — R141 Gas pain: Secondary | ICD-10-CM

## 2012-05-13 DIAGNOSIS — R143 Flatulence: Secondary | ICD-10-CM

## 2012-05-13 LAB — IGA: IgA: 182 mg/dL (ref 68–378)

## 2012-05-13 MED ORDER — ALIGN PO CAPS: 1.0000 | ORAL_CAPSULE | Freq: Every day | ORAL | Status: DC

## 2012-05-13 NOTE — Patient Instructions (Addendum)
Please get your abdominal x-ray when you start having abdominal pain and bloating. X-ray department is located in this building basement level. Hours are: 7:30 am to 5:30 pm.  We have given you samples of Align. This puts good bacteria back into your colon. You should take 1 capsule by mouth once daily for two weeks. If this works well for you, it can be purchased over the counter.  Please go to the basement for lab work before leaving today  Please make follow up appointment with Dr. Rhea Belton for three weeks

## 2012-05-13 NOTE — Progress Notes (Addendum)
05/13/2012 Justin Norris [id number] September 20, 1970   HISTORY OF PRESENT ILLNESS: Patient is a 41 year old male who is self-referred for evaluation of abdominal pain. He has an umbilical hernia recently evaluated by surgery and is for eventual hernia repair. He wanted to make sure nothing else was going on in his abdomen for undergoing surgery.  Patient has had right sided abdominal pain on a daily basis since March. He has right sided bloating as well. The bloating and pain sometimes begin first thing in the morning before taking any PO. Pain sometimes relieved with flatus and / or having a BM. Sometimes neither help. Sometimes resting and calming down after a long day at work helps . No blood in stool, no vomiting. He tried Miralax thinking this could be constipation. He is having daily BMs. No unusual weight loss, he has in fact gained weight. Patient has chronic right shoulder pain. Someone told him hydrocodone is back to the intestines so he has not been taking it lately.  Patient has chronic intermittent heartburn but not as often since discontinuing spicy foods and eating more grilled things.    Past Medical History  Diagnosis Date  . ANXIETY 07/15/2007  . BACK PAIN, RIGHT 07/10/2010  . Headache 07/15/2007  . NEPHROLITHIASIS, HX OF 07/15/2007   Past Surgical History  Procedure Date  . Hernia repair     [facility]    reports that he has been smoking Cigarettes.  He has smoked for the past 20 years. He has never used smokeless tobacco. He reports that he drinks alcohol. He reports that he does not use illicit drugs. family history includes Heart disease in his father. No Known Allergies    Outpatient Encounter Prescriptions as of 05/13/2012  Medication Sig Dispense Refill  . ALPRAZolam (XANAX) 0.5 MG tablet Take 1 tablet (0.5 mg total) by mouth 2 (two) times daily as needed.  60 tablet  3  . DISCONTD: HYDROMET 5-1.5 MG/5ML syrup TAKE 1 TEASPOONFUL BY MOUTH EVERY 6 HOURS AS NEEDED FOR COUGH   120 mL  0     REVIEW OF SYSTEMS  : All other systems reviewed and negative except where noted in the History of Present Illness.   PHYSICAL EXAM: BP 114/72  Pulse 80  Wt 215 lb (97.523 kg) General: Well developed white male in no acute distress Head: Normocephalic and atraumatic Eyes:  sclerae anicteric,conjunctive pink. Ears: Normal auditory acuity Neck: Supple, no masses.  Lungs: Clear throughout to auscultation Heart: Regular rate and rhythm Abdomen: Soft, nontender, non distended. No masses or hepatomegaly noted. Normal bowel sounds. Small umbilical hernia. Musculoskeletal: Symmetrical with no gross deformities  Skin: No lesions on visible extremities Extremities: No edema  Neurological: Alert oriented x 4, grossly nonfocal Cervical Nodes:  No significant cervical adenopathy Psychological:  Alert and cooperative. Normal mood and affect  ASSESSMENT AND PLAN:  61. 41 year old male with several month history of right-sided abdominal bloating and pain. No weight loss, bowel movements are normal. No associated nausea. Suspect functional in nature. It sounds like patient is experiencing trapped gas (maybe at flexure). Celiac disease not likely but need to exclude. Patient feels okay at present. I have asked him to come for abdominal films during his next acute episode (I have put orders in the computer for the x-rays) . Will try a two-week course of align. Patient will follow up with Dr. Rhea Belton in 3 weeks.  2. umbilical hernia. Patient evaluated by surgery (Dr. Luisa Hart) in May of this year. Hernia repair was  discussed, no definite plans for surgery yet. Of note, patient had a CT scan of the abdomen and pelvis without IV contrast June 2012 for evaluation of hematuria, right flank pain and abdominal pain. Findings included only scattered diverticular changes  Addendum: Reviewed and agree with initial management. Beverley Fiedler, MD

## 2012-05-16 LAB — TISSUE TRANSGLUTAMINASE, IGA: Tissue Transglutaminase Ab, IgA: 2.7 U/mL (ref <20)

## 2012-05-30 ENCOUNTER — Ambulatory Visit (INDEPENDENT_AMBULATORY_CARE_PROVIDER_SITE_OTHER)
Admission: RE | Admit: 2012-05-30 | Discharge: 2012-05-30 | Disposition: A | Discharge status: Home / Self Care | Payer: BC Managed Care – PPO | Source: Ambulatory Visit | Attending: Nurse Practitioner | Admitting: Nurse Practitioner

## 2012-05-30 DIAGNOSIS — R140 Abdominal distension (gaseous): Secondary | ICD-10-CM

## 2012-05-30 DIAGNOSIS — R109 Unspecified abdominal pain: Secondary | ICD-10-CM

## 2012-05-30 DIAGNOSIS — R142 Eructation: Secondary | ICD-10-CM

## 2012-06-10 ENCOUNTER — Ambulatory Visit (INDEPENDENT_AMBULATORY_CARE_PROVIDER_SITE_OTHER): Payer: BC Managed Care – PPO | Admitting: Gastroenterology

## 2012-06-10 ENCOUNTER — Encounter: Payer: Self-pay | Admitting: Gastroenterology

## 2012-06-10 VITALS — BP 118/72 | HR 83 | Ht 71.0 in | Wt 210.0 lb

## 2012-06-10 DIAGNOSIS — R109 Unspecified abdominal pain: Secondary | ICD-10-CM

## 2012-06-10 MED ORDER — HYOSCYAMINE SULFATE ER 0.375 MG PO TBCR: EXTENDED_RELEASE_TABLET | ORAL | Status: DC

## 2012-06-10 NOTE — Patient Instructions (Addendum)
Call back 3-4 days and let us know your progress

## 2012-06-10 NOTE — Assessment & Plan Note (Addendum)
I am suspicious that Justin Norris' is pain is 2 to abdominal wall pain. This most likely is related to the umbilical hernia. Nonulcer dyspepsia may be contributing but is less likely a primary problem.  Recommendations #1 trial of hyomax 0.375 mg twice a day; if not improved I would consider a trial of NSAIDs. Finally, if no improvement I would proceed with umbilical hernia repair.

## 2012-06-10 NOTE — Progress Notes (Signed)
History of Present Illness:  Justin Norris continues to complain of abdominal pain. Pain tends to be in the umbilical area and may radiate through or around to the back. It is sometimes worsened postprandially or with straining when he moves his bowels. At times he is unable to bend over or twist because of pain. He has an umbilical hernia. He does have pyrosis intermittently. He denies nausea or change in bowel habits. With anxiety and tension he has more pain.  It is noteworthy that the patient at times cannot bend over to pick up items at work because of abdominal pain and he has had to give up golf because he is unable to swing the club due to pain.    Review of Systems: Pertinent positive and negative review of systems were noted in the above HPI section. All other review of systems were otherwise negative.    Current Medications, Allergies, Past Medical History, Past Surgical History, Family History and Social History were reviewed in [employer] electronic medical record  Vital signs were reviewed in today's medical record. Physical Exam: General: Well developed , well nourished, no acute distress On abdominal exam he is minimally tender in the periumbilical area. There is an umbilical hernia of moderate size.

## 2012-06-20 ENCOUNTER — Telehealth: Payer: Self-pay | Admitting: Gastroenterology

## 2012-06-20 MED ORDER — SULINDAC 200 MG PO TABS: 200.0000 mg | ORAL_TABLET | Freq: Two times a day (BID) | ORAL | Status: DC

## 2012-06-20 NOTE — Telephone Encounter (Signed)
Begin Clinoril 200 mg twice a day. Call back in 4-5 days. If he develops any stomach pain with Clinoril he should call back immediately. Discontinue hyoscyamine. Before referred to surgery we'll try Clinoril first

## 2012-06-20 NOTE — Telephone Encounter (Signed)
Pt called and states that the hyoscyamine is not working. Pt calling wanting to know if he needs to go ahead with surgical repair of hernia. Please advise.

## 2012-06-20 NOTE — Telephone Encounter (Signed)
Spoke with pt and he is aware. Rx sent to the pharmacy. 

## 2012-06-22 ENCOUNTER — Telehealth (INDEPENDENT_AMBULATORY_CARE_PROVIDER_SITE_OTHER): Payer: Self-pay

## 2012-06-22 NOTE — Telephone Encounter (Signed)
Message copied by Brennan Bailey on Wed Jun 22, 2012 11:20 AM ------      Message from: Zacarias Pontes      Created: Mon Jun 20, 2012 10:17 AM       Pt wants an apt this month for reck on hernia and sched sx. He doesn't want to wait until next year you can call him back at [phone].I told him I didn't think he would have anything avail but he insisted

## 2012-06-22 NOTE — Telephone Encounter (Signed)
Called patient back. He asked if he could get appt and surgery before end of year. 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 I gave him first available appt for 1/3 and told him if anyone cancels I would call him back.

## 2012-07-22 ENCOUNTER — Ambulatory Visit (INDEPENDENT_AMBULATORY_CARE_PROVIDER_SITE_OTHER): Payer: BC Managed Care – PPO | Admitting: Surgery

## 2012-07-22 ENCOUNTER — Encounter (INDEPENDENT_AMBULATORY_CARE_PROVIDER_SITE_OTHER): Payer: Self-pay | Admitting: Surgery

## 2012-07-22 VITALS — BP 110/82 | HR 72 | Temp 95.6°F | Resp 18 | Ht 71.0 in | Wt 211.0 lb

## 2012-07-22 DIAGNOSIS — K429 Umbilical hernia without obstruction or gangrene: Secondary | ICD-10-CM

## 2012-07-22 NOTE — Progress Notes (Signed)
Patient ID: Justin Norris, male   DOB: 17-Jul-1971, 42 y.o.   MRN: [id number]  Chief Complaint  Patient presents with  . Pre-op Exam    Hernia    HPI Justin Norris is a 42 y.o. male.   HPI Patient sent at the request of Dr. Amador Cunas due to abdominal pain. He has had it for one year. He describes the pain as sharp located run his umbilicus made worse with lifting or exertion. He also has some right lower quadrant and left lower quadrant pain and bloating. He does have some issues with constipation. He has changed jobs recently and does a lot of driving and eating fast food. He has been gaining weight. He drinks multiple caffeinated beverages. He also has pain in his lower back. As the blood in stool, nausea or vomiting. He returns to discuss umbilical hernia repair. Constipation better.  Past Medical History  Diagnosis Date  . ANXIETY 07/15/2007  . BACK PAIN, RIGHT 07/10/2010  . Headache 07/15/2007  . NEPHROLITHIASIS, HX OF 07/15/2007    Past Surgical History  Procedure Date  . Hernia repair     [facility]    Family History  Problem Relation Age of Onset  . Heart disease Father     Social History History  Substance Use Topics  . Smoking status: Current Some Day Smoker -- 20 years    Types: Cigarettes  . Smokeless tobacco: Never Used  . Alcohol Use: Yes     Comment: rarely    No Known Allergies  Current Outpatient Prescriptions  Medication Sig Dispense Refill  . ALPRAZolam (XANAX) 0.5 MG tablet Take 1 tablet (0.5 mg total) by mouth 2 (two) times daily as needed.  60 tablet  3  . bifidobacterium infantis (ALIGN) capsule Take 1 capsule by mouth daily.  14 capsule  0  . HYDROcodone-acetaminophen (VICODIN) 5-500 MG per tablet       . Hyoscyamine Sulfate 0.375 MG TBCR Take one tablet twice a day  for abdominal pain for 4-5 days then as needed  25 tablet  1  . sulindac (CLINORIL) 200 MG tablet Take 1 tablet (200 mg total) by mouth 2 (two) times daily.  60 tablet  0    Review of  Systems Review of Systems  Constitutional: Positive for unexpected weight change.  HENT: Negative.   Eyes: Negative.   Respiratory: Negative.   Cardiovascular: Negative.   Gastrointestinal: Positive for abdominal pain, constipation and abdominal distention.  Genitourinary: Negative.   Musculoskeletal: Negative.   Neurological: Negative.   Hematological: Negative.   Psychiatric/Behavioral: Negative.     Blood pressure 110/82, pulse 72, temperature 95.6 F (35.3 C), temperature source Temporal, resp. rate 18, height 5\' 11"  (1.803 m), weight 211 lb (95.709 kg).  Physical Exam Physical Exam  Constitutional: He is oriented to person, place, and time. He appears well-developed and well-nourished.  HENT:  Head: Normocephalic and atraumatic.  Eyes: EOM are normal. Pupils are equal, round, and reactive to light.  Neck: Normal range of motion. Neck supple.  Cardiovascular: Normal rate and regular rhythm.   Pulmonary/Chest: Effort normal and breath sounds normal.  Abdominal: Soft. Bowel sounds are normal. He exhibits distension.   small umbilical hernia reducible Musculoskeletal: Normal range of motion.  Neurological: He is alert and oriented to person, place, and time.  Skin: Skin is warm and dry.  Psychiatric: He has a normal mood and affect. His behavior is normal. Thought content normal.    Data Reviewed   Assessment  Umbilical hernia symptomatic  Constipation improved on Miralax    Plan      HE IS READY TO HAVE HIS HERNIA REPAIRED.The risk of hernia repair include bleeding,  Infection,   Recurrence of the hernia,  Mesh use, chronic pain,  Organ injury,  Bowel injury,  Bladder injury,   nerve injury with numbness around the incision,  Death,  and worsening of preexisting  medical problems.  The alternatives to surgery have been discussed as well..  Long term expectations of both operative and non operative treatments have been discussed.   The patient agrees to  proceed.    Shashana Fullington A. 07/22/2012, 12:21 PM

## 2012-07-22 NOTE — Patient Instructions (Signed)
Hernia A hernia occurs when an internal organ pushes out through a weak spot in the abdominal wall. Hernias most commonly occur in the groin and around the navel. Hernias often can be pushed back into place (reduced). Most hernias tend to get worse over time. Some abdominal hernias can get stuck in the opening (irreducible or incarcerated hernia) and cannot be reduced. An irreducible abdominal hernia which is tightly squeezed into the opening is at risk for impaired blood supply (strangulated hernia). A strangulated hernia is a medical emergency. Because of the risk for an irreducible or strangulated hernia, surgery may be recommended to repair a hernia. CAUSES   Heavy lifting.  Prolonged coughing.  Straining to have a bowel movement.  A cut (incision) made during an abdominal surgery. HOME CARE INSTRUCTIONS   Bed rest is not required. You may continue your normal activities.  Avoid lifting more than 10 pounds (4.5 kg) or straining.  Cough gently. If you are a smoker it is best to stop. Even the best hernia repair can break down with the continual strain of coughing. Even if you do not have your hernia repaired, a cough will continue to aggravate the problem.  Do not wear anything tight over your hernia. Do not try to keep it in with an outside bandage or truss. These can damage abdominal contents if they are trapped within the hernia sac.  Eat a normal diet.  Avoid constipation. Straining over long periods of time will increase hernia size and encourage breakdown of repairs. If you cannot do this with diet alone, stool softeners may be used. SEEK IMMEDIATE MEDICAL CARE IF:   You have a fever.  You develop increasing abdominal pain.  You feel nauseous or vomit.  Your hernia is stuck outside the abdomen, looks discolored, feels hard, or is tender.  You have any changes in your bowel habits or in the hernia that are unusual for you.  You have increased pain or swelling around the  hernia.  You cannot push the hernia back in place by applying gentle pressure while lying down. MAKE SURE YOU:   Understand these instructions.  Will watch your condition.  Will get help right away if you are not doing well or get worse. Document Released: 07/06/2005 Document Revised: 09/28/2011 Document Reviewed: 02/23/2008 [employer] Patient Information 2013 [employer].  

## 2012-08-25 DIAGNOSIS — K429 Umbilical hernia without obstruction or gangrene: Secondary | ICD-10-CM

## 2012-09-09 ENCOUNTER — Encounter (INDEPENDENT_AMBULATORY_CARE_PROVIDER_SITE_OTHER): Payer: BC Managed Care – PPO | Admitting: Surgery

## 2012-09-12 ENCOUNTER — Ambulatory Visit (INDEPENDENT_AMBULATORY_CARE_PROVIDER_SITE_OTHER): Payer: BC Managed Care – PPO | Admitting: Surgery

## 2012-09-12 ENCOUNTER — Encounter (INDEPENDENT_AMBULATORY_CARE_PROVIDER_SITE_OTHER): Payer: Self-pay | Admitting: Surgery

## 2012-09-12 VITALS — BP 110/80 | HR 83 | Temp 97.8°F | Resp 18 | Ht 71.5 in | Wt 209.8 lb

## 2012-09-12 DIAGNOSIS — Z9889 Other specified postprocedural states: Secondary | ICD-10-CM

## 2012-09-12 NOTE — Progress Notes (Signed)
Pt returns today after UMBILICAL  hernia repair.  Pain is well controlled.  Bowels are functioning.  Wound is clean.  On exam:  Incision is clean /dry/intact.  Area is soft without signs of hernia recurrence.  Impression:  Status repair of  UMBILICAL hernia repair with mesh  Plan:  RTC PRN  Return to full activity  in  2  Weeks.

## 2012-09-12 NOTE — Patient Instructions (Signed)
RETURN AS NEEDED 

## 2012-11-25 ENCOUNTER — Encounter: Payer: Self-pay | Admitting: Internal Medicine

## 2012-11-25 ENCOUNTER — Ambulatory Visit (INDEPENDENT_AMBULATORY_CARE_PROVIDER_SITE_OTHER): Payer: BC Managed Care – PPO | Admitting: Internal Medicine

## 2012-11-25 VITALS — BP 120/80 | HR 60 | Temp 98.2°F | Resp 18 | Wt 204.0 lb

## 2012-11-25 DIAGNOSIS — M549 Dorsalgia, unspecified: Secondary | ICD-10-CM

## 2012-11-25 DIAGNOSIS — F411 Generalized anxiety disorder: Secondary | ICD-10-CM

## 2012-11-25 MED ORDER — PODOFILOX 0.5 % EX SOLN: Freq: Two times a day (BID) | CUTANEOUS | Status: DC

## 2012-11-25 MED ORDER — ALPRAZOLAM 0.5 MG PO TABS: 0.5000 mg | ORAL_TABLET | Freq: Two times a day (BID) | ORAL | Status: DC | PRN

## 2012-11-25 MED ORDER — METHYLPREDNISOLONE ACETATE 80 MG/ML IJ SUSP
80.0000 mg | Freq: Once | INTRAMUSCULAR | Status: AC
  Administered 2012-11-25: 80 mg via INTRAMUSCULAR

## 2012-11-25 MED ORDER — HYDROCODONE-ACETAMINOPHEN 5-325 MG PO TABS: 1.0000 | ORAL_TABLET | Freq: Four times a day (QID) | ORAL | Status: DC | PRN

## 2012-11-25 NOTE — Progress Notes (Signed)
Subjective:    Patient ID: Justin Norris, male    DOB: 11-08-1970, 42 y.o.   MRN: [id number]  HPI  42 year old patient who is seen today with a chief complaint of right lumbar back pain. This has been a issue in the past. He's had umbilical hernia surgery in approximately 2 weeks ago played his first round of golf. He has had the right lumbar back pain intermittently since that time. He does have a history of anxiety as well as nephrolithiasis.  Past Medical History  Diagnosis Date  . ANXIETY 07/15/2007  . BACK PAIN, RIGHT 07/10/2010  . Headache 07/15/2007  . NEPHROLITHIASIS, HX OF 07/15/2007    History   Social History  . Marital Status: Single    Spouse Name: N/A    Number of Children: 0  . Years of Education: N/A   Occupational History  . Data Technician    Social History Main Topics  . Smoking status: Current Some Day Smoker -- 20 years    Types: Cigarettes  . Smokeless tobacco: Never Used  . Alcohol Use: Yes     Comment: rarely  . Drug Use: No  . Sexually Active: Not on file   Other Topics Concern  . Not on file   Social History Narrative  . No narrative on file    Past Surgical History  Procedure Laterality Date  . Hernia repair      [facility]    Family History  Problem Relation Age of Onset  . Heart disease Father     No Known Allergies  Current Outpatient Prescriptions on File Prior to Visit  Medication Sig Dispense Refill  . bifidobacterium infantis (ALIGN) capsule Take 1 capsule by mouth daily.  14 capsule  0  . sulindac (CLINORIL) 200 MG tablet Take 1 tablet (200 mg total) by mouth 2 (two) times daily.  60 tablet  0   No current facility-administered medications on file prior to visit.    BP 120/80  Pulse 60  Temp(Src) 98.2 F (36.8 C) (Oral)  Resp 18  Wt 204 lb (92.534 kg)  BMI 28.06 kg/m2  SpO2 98%       Review of Systems  Constitutional: Negative for fever, chills, appetite change and fatigue.  HENT: Negative for hearing loss, ear  pain, congestion, sore throat, trouble swallowing, neck stiffness, dental problem, voice change and tinnitus.   Eyes: Negative for pain, discharge and visual disturbance.  Respiratory: Negative for cough, chest tightness, wheezing and stridor.   Cardiovascular: Negative for chest pain, palpitations and leg swelling.  Gastrointestinal: Negative for nausea, vomiting, abdominal pain, diarrhea, constipation, blood in stool and abdominal distention.  Genitourinary: Negative for urgency, hematuria, flank pain, discharge, difficulty urinating and genital sores.  Musculoskeletal: Positive for back pain. Negative for myalgias, joint swelling, arthralgias and gait problem.  Skin: Negative for rash.  Neurological: Negative for dizziness, syncope, speech difficulty, weakness, numbness and headaches.  Hematological: Negative for adenopathy. Does not bruise/bleed easily.  Psychiatric/Behavioral: Negative for behavioral problems and dysphoric mood. The patient is not nervous/anxious.        Objective:   Physical Exam  Constitutional: He appears well-developed and well-nourished. No distress.  Musculoskeletal:  Right lumbar musculature slightly tight and tense Straight leg testing negative but generally stiff with diminished range of motion bilaterally          Assessment & Plan:    R Lumber Strain;   Will treat symptomatically and also with depo medrol; meds refilled Anxiety disorder

## 2012-11-25 NOTE — Patient Instructions (Addendum)
Back Pain, Adult Low back pain is very common. About 1 in 5 people have back pain.The cause of low back pain is rarely dangerous. The pain often gets better over time.About half of people with a sudden onset of back pain feel better in just 2 weeks. About 8 in 10 people feel better by 6 weeks.  CAUSES Some common causes of back pain include:  Strain of the muscles or ligaments supporting the spine.  Wear and tear (degeneration) of the spinal discs.  Arthritis.  Direct injury to the back. DIAGNOSIS Most of the time, the direct cause of low back pain is not known.However, back pain can be treated effectively even when the exact cause of the pain is unknown.Answering your caregiver's questions about your overall health and symptoms is one of the most accurate ways to make sure the cause of your pain is not dangerous. If your caregiver needs more information, he or she may order lab work or imaging tests (X-rays or MRIs).However, even if imaging tests show changes in your back, this usually does not require surgery. HOME CARE INSTRUCTIONS For many people, back pain returns.Since low back pain is rarely dangerous, it is often a condition that people can learn to manageon their own.   Remain active. It is stressful on the back to sit or stand in one place. Do not sit, drive, or stand in one place for more than 30 minutes at a time. Take short walks on level surfaces as soon as pain allows.Try to increase the length of time you walk each day.  Do not stay in bed.Resting more than 1 or 2 days can delay your recovery.  Do not avoid exercise or work.Your body is made to move.It is not dangerous to be active, even though your back may hurt.Your back will likely heal faster if you return to being active before your pain is gone.  Pay attention to your body when you bend and lift. Many people have less discomfortwhen lifting if they bend their knees, keep the load close to their bodies,and  avoid twisting. Often, the most comfortable positions are those that put less stress on your recovering back.  Find a comfortable position to sleep. Use a firm mattress and lie on your side with your knees slightly bent. If you lie on your back, put a pillow under your knees.  Only take over-the-counter or prescription medicines as directed by your caregiver. Over-the-counter medicines to reduce pain and inflammation are often the most helpful.Your caregiver may prescribe muscle relaxant drugs.These medicines help dull your pain so you can more quickly return to your normal activities and healthy exercise.  Put ice on the injured area.  Put ice in a plastic bag.  Place a towel between your skin and the bag.  Leave the ice on for 15 to 20 minutes, 3 to 4 times a day for the first 2 to 3 days. After that, ice and heat may be alternated to reduce pain and spasms.  Ask your caregiver about trying back exercises and gentle massage. This may be of some benefit.  Avoid feeling anxious or stressed.Stress increases muscle tension and can worsen back pain.It is important to recognize when you are anxious or stressed and learn ways to manage it.Exercise is a great option. SEEK MEDICAL CARE IF:  You have pain that is not relieved with rest or medicine.  You have pain that does not improve in 1 week.  You have new symptoms.  You are generally   not feeling well. SEEK IMMEDIATE MEDICAL CARE IF:   You have pain that radiates from your back into your legs.  You develop new bowel or bladder control problems.  You have unusual weakness or numbness in your arms or legs.  You develop nausea or vomiting.  You develop abdominal pain.  You feel faint. Document Released: 07/06/2005 Document Revised: 01/05/2012 Document Reviewed: 11/24/2010 Exit[employer] Patient Information 2013 [employer].  

## 2013-05-16 ENCOUNTER — Telehealth: Payer: Self-pay | Admitting: Internal Medicine

## 2013-05-16 NOTE — Telephone Encounter (Addendum)
Pt states he gets an cortisone inj for his back and allergies.  Pt is working at [employer] and cannot leave until late Fri afternoon.  Pt would like to know if he could come to the [facility] and get an injection. Pt states his back is really hurting and you do inj for him every 6 mos or so. The inj helps his allergies too. Pls advise

## 2013-05-16 NOTE — Telephone Encounter (Signed)
Left detailed message that he can not schedule with Saturday clinic will need to be seen here in the office with Dr.K.

## 2013-05-17 NOTE — Telephone Encounter (Signed)
Pt is going to see if he can get off work early Fri. Pt traveling from [employer] .pt wouldlike to come in last appt of the day Friday, if his boss agrees he can get off. pls advise. These are SD appts

## 2013-05-17 NOTE — Telephone Encounter (Signed)
That is fine you can schedule him last appointment of the day.

## 2013-05-23 NOTE — Telephone Encounter (Signed)
Pt will call back if he can work it out.

## 2013-05-24 ENCOUNTER — Encounter: Payer: Self-pay | Admitting: Internal Medicine

## 2013-05-24 ENCOUNTER — Ambulatory Visit (INDEPENDENT_AMBULATORY_CARE_PROVIDER_SITE_OTHER): Payer: BC Managed Care – PPO | Admitting: Internal Medicine

## 2013-05-24 VITALS — BP 122/80 | HR 70 | Temp 98.1°F | Resp 20 | Wt 205.0 lb

## 2013-05-24 DIAGNOSIS — F411 Generalized anxiety disorder: Secondary | ICD-10-CM

## 2013-05-24 DIAGNOSIS — M549 Dorsalgia, unspecified: Secondary | ICD-10-CM

## 2013-05-24 MED ORDER — METHYLPREDNISOLONE ACETATE 80 MG/ML IJ SUSP
80.0000 mg | Freq: Once | INTRAMUSCULAR | Status: AC
  Administered 2013-05-24: 80 mg via INTRAMUSCULAR

## 2013-05-24 MED ORDER — ALPRAZOLAM 0.5 MG PO TABS: 0.5000 mg | ORAL_TABLET | Freq: Two times a day (BID) | ORAL | Status: DC | PRN

## 2013-05-24 MED ORDER — HYDROCODONE-ACETAMINOPHEN 5-325 MG PO TABS
1.0000 | ORAL_TABLET | Freq: Four times a day (QID) | ORAL | Status: DC | PRN
Start: 2013-05-24 — End: 2014-09-14

## 2013-05-24 NOTE — Progress Notes (Signed)
Subjective:    Patient ID: Justin Norris, male    DOB: 1970-10-16, 42 y.o.   MRN: [id number]  HPI Pre-visit discussion using our clinic review tool. No additional management support is needed unless otherwise documented below in the visit note.  42 year old patient who has a history of intermittent back pain. Has a history also of anxiety disorder and does use alprazolam when necessary. For the past several days she has had a flare of his right lumbar pain. Is requesting a shot of Depo-Medrol. He's only had one prior injection over the past 12 months.  No radicular symptoms  Past Medical History  Diagnosis Date  . ANXIETY 07/15/2007  . BACK PAIN, RIGHT 07/10/2010  . Headache(784.0) 07/15/2007  . NEPHROLITHIASIS, HX OF 07/15/2007    History   Social History  . Marital Status: Single    Spouse Name: N/A    Number of Children: 0  . Years of Education: N/A   Occupational History  . Data Technician    Social History Main Topics  . Smoking status: Current Some Day Smoker -- 20 years    Types: Cigarettes  . Smokeless tobacco: Never Used  . Alcohol Use: Yes     Comment: rarely  . Drug Use: No  . Sexual Activity: Not on file   Other Topics Concern  . Not on file   Social History Narrative  . No narrative on file    Past Surgical History  Procedure Laterality Date  . Hernia repair      [facility]    Family History  Problem Relation Age of Onset  . Heart disease Father     No Known Allergies  Current Outpatient Prescriptions on File Prior to Visit  Medication Sig Dispense Refill  . podofilox (CONDYLOX) 0.5 % external solution Apply topically 2 (two) times daily.  3.5 mL  2   No current facility-administered medications on file prior to visit.    BP 122/80  Pulse 70  Temp(Src) 98.1 F (36.7 C) (Oral)  Resp 20  Wt 205 lb (92.987 kg)  SpO2 98%      Review of Systems  Constitutional: Negative for fever, chills, appetite change and fatigue.  HENT: Negative  for congestion, dental problem, ear pain, hearing loss, sore throat, tinnitus, trouble swallowing and voice change.   Eyes: Negative for pain, discharge and visual disturbance.  Respiratory: Negative for cough, chest tightness, wheezing and stridor.   Cardiovascular: Negative for chest pain, palpitations and leg swelling.  Gastrointestinal: Negative for nausea, vomiting, abdominal pain, diarrhea, constipation, blood in stool and abdominal distention.  Genitourinary: Negative for urgency, hematuria, flank pain, discharge, difficulty urinating and genital sores.  Musculoskeletal: Positive for back pain. Negative for arthralgias, gait problem, joint swelling, myalgias and neck stiffness.       Right shoulder pain   Skin: Negative for rash.  Neurological: Negative for dizziness, syncope, speech difficulty, weakness, numbness and headaches.  Hematological: Negative for adenopathy. Does not bruise/bleed easily.  Psychiatric/Behavioral: Negative for behavioral problems and dysphoric mood. The patient is not nervous/anxious.        Objective:   Physical Exam  Constitutional: He appears well-developed and well-nourished. No distress.  Musculoskeletal:  Straight leg test and limited due to stiffness but negative No motor weakness Reflexes intact          Assessment & Plan:   Lumbar strain. We'll treat with Depo-Medrol. He does have prescription for analgesics as needed. Information concerning low back pain prevention dispensed  Return  in 6 months or at  his convenience for an annual exam

## 2013-05-24 NOTE — Patient Instructions (Signed)
Back Injury Prevention °Back injuries can be extremely painful and difficult to heal. After having one back injury, you are much more likely to experience another later on. It is important to learn how to avoid injuring or re-injuring your back. The following tips can help you to prevent a back injury. °PHYSICAL FITNESS °· Exercise regularly and try to develop good tone in your abdominal muscles. Your abdominal muscles provide a lot of the support needed by your back. °· Do aerobic exercises (walking, jogging, biking, swimming) regularly. °· Do exercises that increase balance and strength (tai chi, yoga) regularly. This can decrease your risk of falling and injuring your back. °· Stretch before and after exercising. °· Maintain a healthy weight. The more you weigh, the more stress is placed on your back. For every pound of weight, 10 times that amount of pressure is placed on the back. °DIET °· Talk to your caregiver about how much calcium and vitamin D you need per day. These nutrients help to prevent weakening of the bones (osteoporosis). Osteoporosis can cause broken (fractured) bones that lead to back pain. °· Include good sources of calcium in your diet, such as dairy products, green, leafy vegetables, and products with calcium added (fortified). °· Include good sources of vitamin D in your diet, such as milk and foods that are fortified with vitamin D. °· Consider taking a nutritional supplement or a multivitamin if needed. °· Stop smoking if you smoke. °POSTURE °· Sit and stand up straight. Avoid leaning forward when you sit or hunching over when you stand. °· Choose chairs with good low back (lumbar) support. °· If you work at a desk, sit close to your work so you do not need to lean over. Keep your chin tucked in. Keep your neck drawn back and elbows bent at a right angle. Your arms should look like the letter "L." °· Sit high and close to the steering wheel when you drive. Add a lumbar support to your car  seat if needed. °· Avoid sitting or standing in one position for too long. Take breaks to get up, stretch, and walk around at least once every hour. Take breaks if you are driving for long periods of time. °· Sleep on your side with your knees slightly bent, or sleep on your back with a pillow under your knees. Do not sleep on your stomach. °LIFTING, TWISTING, AND REACHING °· Avoid heavy lifting, especially repetitive lifting. If you must do heavy lifting: °· Stretch before lifting. °· Work slowly. °· Rest between lifts. °· Use carts and dollies to move objects when possible. °· Make several small trips instead of carrying 1 heavy load. °· Ask for help when you need it. °· Ask for help when moving big, awkward objects. °· Follow these steps when lifting: °· Stand with your feet shoulder-width apart. °· Get as close to the object as you can. Do not try to pick up heavy objects that are far from your body. °· Use handles or lifting straps if they are available. °· Bend at your knees. Squat down, but keep your heels off the floor. °· Keep your shoulders pulled back, your chin tucked in, and your back straight. °· Lift the object slowly, tightening the muscles in your legs, abdomen, and buttocks. Keep the object as close to the center of your body as possible. °· When you put a load down, use these same guidelines in reverse. °· Do not: °· Lift the object above your waist. °·   Twist at the waist while lifting or carrying a load. Move your feet if you need to turn, not your waist. °· Bend over without bending at your knees. °· Avoid reaching over your head, across a table, or for an object on a high surface. °OTHER TIPS °· Avoid wet floors and keep sidewalks clear of ice to prevent falls. °· Do not sleep on a mattress that is too soft or too hard. °· Keep items that are used frequently within easy reach. °· Put heavier objects on shelves at waist level and lighter objects on lower or higher shelves. °· Find ways to  decrease your stress, such as exercise, massage, or relaxation techniques. Stress can build up in your muscles. Tense muscles are more vulnerable to injury. °· Seek treatment for depression or anxiety if needed. These conditions can increase your risk of developing back pain. °SEEK MEDICAL CARE IF: °· You injure your back. °· You have questions about diet, exercise, or other ways to prevent back injuries. °MAKE SURE YOU: °· Understand these instructions. °· Will watch your condition. °· Will get help right away if you are not doing well or get worse. °Document Released: 08/13/2004 Document Revised: 09/28/2011 Document Reviewed: 08/17/2011 °[employer]® Patient Information ©2014 [employer]. ° °

## 2013-09-21 ENCOUNTER — Ambulatory Visit (INDEPENDENT_AMBULATORY_CARE_PROVIDER_SITE_OTHER): Payer: BC Managed Care – PPO | Admitting: Family Medicine

## 2013-09-21 ENCOUNTER — Encounter: Payer: Self-pay | Admitting: Family Medicine

## 2013-09-21 VITALS — BP 118/88 | Temp 98.7°F | Wt 209.0 lb

## 2013-09-21 DIAGNOSIS — M549 Dorsalgia, unspecified: Secondary | ICD-10-CM

## 2013-09-21 MED ORDER — METHYLPREDNISOLONE ACETATE 80 MG/ML IJ SUSP
80.0000 mg | Freq: Once | INTRAMUSCULAR | Status: AC
  Administered 2013-09-21: 80 mg via INTRAMUSCULAR

## 2013-09-21 MED ORDER — TRAMADOL HCL 50 MG PO TABS: 50.0000 mg | ORAL_TABLET | Freq: Three times a day (TID) | ORAL | Status: DC | PRN

## 2013-09-21 NOTE — Addendum Note (Signed)
Addended by: Azucena FreedMILLNER, Brion Hedges C on: 09/21/2013 04:43 PM   Modules accepted: Orders

## 2013-09-21 NOTE — Progress Notes (Signed)
Pre visit review using our clinic review tool, if applicable. No additional management support is needed unless otherwise documented below in the visit note. 

## 2013-09-21 NOTE — Patient Instructions (Signed)
-   heat for 15 minutes twice daily as daily  -ultram as needed  -exercises provided   -follow up with your doctor in 4 weeks or sooner as needed

## 2013-09-21 NOTE — Progress Notes (Signed)
Chief Complaint  Patient presents with  . Back Pain    HPI:  Back Pain: -reports has occ flares in back pain chronically -had flare that started yesterday lifting ladders and tweaked on the R side and R shoulder -reports has chronic issues with shoulder on and off too -certain movement worsen his pain -has tried BC powder but he does not want to take this  -denies: fever, weakness, numbness, bowel or bladder incontinence  ROS: See pertinent positives and negatives per HPI.  Past Medical History  Diagnosis Date  . ANXIETY 07/15/2007  . BACK PAIN, RIGHT 07/10/2010  . Headache(784.0) 07/15/2007  . NEPHROLITHIASIS, HX OF 07/15/2007    Past Surgical History  Procedure Laterality Date  . Hernia repair      [facility]    Family History  Problem Relation Age of Onset  . Heart disease Father     History   Social History  . Marital Status: Single    Spouse Name: N/A    Number of Children: 0  . Years of Education: N/A   Occupational History  . Data Technician    Social History Main Topics  . Smoking status: Current Some Day Smoker -- 20 years    Types: Cigarettes  . Smokeless tobacco: Never Used  . Alcohol Use: Yes     Comment: rarely  . Drug Use: No  . Sexual Activity: None   Other Topics Concern  . None   Social History Narrative  . None    Current outpatient prescriptions:ALPRAZolam (XANAX) 0.5 MG tablet, Take 1 tablet (0.5 mg total) by mouth 2 (two) times daily as needed., Disp: 60 tablet, Rfl: 3;  HYDROcodone-acetaminophen (NORCO/VICODIN) 5-325 MG per tablet, Take 1 tablet by mouth every 6 (six) hours as needed., Disp: 60 tablet, Rfl: 0;  podofilox (CONDYLOX) 0.5 % external solution, Apply topically 2 (two) times daily., Disp: 3.5 mL, Rfl: 2 traMADol (ULTRAM) 50 MG tablet, Take 1 tablet (50 mg total) by mouth every 8 (eight) hours as needed., Disp: 30 tablet, Rfl: 0  EXAM:  Filed Vitals:   09/21/13 1600  BP: 118/88  Temp: 98.7 F (37.1 C)    Body mass  index is 28.75 kg/(m^2).  GENERAL: vitals reviewed and listed above, alert, oriented, appears well hydrated and in no acute distress  HEENT: atraumatic, conjunttiva clear, no obvious abnormalities on inspection of external nose and ears  NECK: no obvious masses on inspection  LUNGS: clear to auscultation bilaterally, no wheezes, rales or rhonchi, good air movement  CV: HRRR, no peripheral edema  MS/NERUO: moves all extremities without noticeable abnormality Normal Gait Normal inspection of back, no obvious scoliosis or leg length descrepancy No bony TTP Soft tissue TTP at: R thoracic paraspinal muscles, R lumbar paraspinal muscles -/+ tests: neg trendelenburg,-facet loading, -SLRT, -CLRT, -FABER, -FADIR Normal muscle strength, sensation to light touch and DTRs in LEs bilaterally  PSYCH: pleasant and cooperative, no obvious depression or anxiety  ASSESSMENT AND PLAN:  Discussed the following assessment and plan:  BACK PAIN, RIGHT - Plan: traMADol (ULTRAM) 50 MG tablet  -no alarm symptoms or neuro deficits and suspect muscular strain -he is ADAMANT about getting a steroid injection, I did share with him that recent data advises against steroid injections in this situation if possible as while they may initially provide short term relief long term outcome may be worse - he prefers to take this risks and other risks associated with this treatment -HEP, heat and supportive care provided along with ultram  for pain if needed -follow up in 4 weeks -Patient advised to return or notify a doctor immediately if symptoms worsen or persist or new concerns arise.  Patient Instructions  - heat for 15 minutes twice daily as daily  -ultram as needed  -exercises provided   -follow up with your doctor in 4 weeks or sooner as needed        KIM, HANNAH R.

## 2013-09-22 ENCOUNTER — Telehealth: Payer: Self-pay | Admitting: Internal Medicine

## 2013-09-22 NOTE — Telephone Encounter (Signed)
Relevant patient education assigned to patient using Emmi. ° °

## 2014-01-05 ENCOUNTER — Other Ambulatory Visit: Payer: Self-pay | Admitting: Internal Medicine

## 2014-01-09 NOTE — Telephone Encounter (Signed)
Okay to refill? 

## 2014-03-19 ENCOUNTER — Encounter: Payer: Self-pay | Admitting: Internal Medicine

## 2014-03-19 ENCOUNTER — Ambulatory Visit (INDEPENDENT_AMBULATORY_CARE_PROVIDER_SITE_OTHER): Payer: BC Managed Care – PPO | Admitting: Internal Medicine

## 2014-03-19 VITALS — BP 138/87 | HR 63 | Temp 98.1°F | Resp 20 | Ht 71.5 in | Wt 217.0 lb

## 2014-03-19 DIAGNOSIS — F17200 Nicotine dependence, unspecified, uncomplicated: Secondary | ICD-10-CM | POA: Insufficient documentation

## 2014-03-19 DIAGNOSIS — J069 Acute upper respiratory infection, unspecified: Secondary | ICD-10-CM

## 2014-03-19 MED ORDER — METHYLPREDNISOLONE ACETATE 80 MG/ML IJ SUSP
80.0000 mg | Freq: Once | INTRAMUSCULAR | Status: AC
  Administered 2014-03-19: 80 mg via INTRAMUSCULAR

## 2014-03-19 NOTE — Progress Notes (Signed)
Pre visit review using our clinic review tool, if applicable. No additional management support is needed unless otherwise documented below in the visit note. 

## 2014-03-19 NOTE — Progress Notes (Signed)
Subjective:    Patient ID: Justin Norris, male    DOB: 08/13/1970, 43 y.o.   MRN: [id number]  HPI  43 year old patient who has a history of ongoing tobacco use.  The patient had a significant URI about 3 weeks ago.  He has improved, but still is persistent mild cough, sinus congestion, rhinorrhea.  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  No fever.  He also describes some occasional urticaria.  Past Medical History  Diagnosis Date  . ANXIETY 07/15/2007  . BACK PAIN, RIGHT 07/10/2010  . Headache(784.0) 07/15/2007  . NEPHROLITHIASIS, HX OF 07/15/2007    History   Social History  . Marital Status: Single    Spouse Name: N/A    Number of Children: 0  . Years of Education: N/A   Occupational History  . Data Technician    Social History Main Topics  . Smoking status: Current Some Day Smoker -- 20 years    Types: Cigarettes  . Smokeless tobacco: Never Used  . Alcohol Use: Yes     Comment: rarely  . Drug Use: No  . Sexual Activity: Not on file   Other Topics Concern  . Not on file   Social History Narrative  . No narrative on file    Past Surgical History  Procedure Laterality Date  . Hernia repair      [facility]    Family History  Problem Relation Age of Onset  . Heart disease Father     No Known Allergies  Current Outpatient Prescriptions on File Prior to Visit  Medication Sig Dispense Refill  . ALPRAZolam (XANAX) 0.5 MG tablet Take 1 tablet (0.5 mg total) by mouth 2 (two) times daily as needed.  60 tablet  3  . HYDROcodone-acetaminophen (NORCO/VICODIN) 5-325 MG per tablet Take 1 tablet by mouth every 6 (six) hours as needed.  60 tablet  0  . podofilox (CONDYLOX) 0.5 % external solution APPLY TO THE AFFECTED AREA TWICE DAILY  3.5 mL  0   No current facility-administered medications on file prior to visit.    BP 138/87  Pulse 63  Temp(Src) 98.1 F (36.7 C) (Oral)  Resp 20  Ht 5' 11.5" (1.816 m)   Wt 217 lb (98.431 kg)  BMI 29.85 kg/m2  SpO2 98%     Review of Systems  Constitutional: Positive for appetite change and fatigue. Negative for fever and chills.  HENT: Positive for congestion, postnasal drip, rhinorrhea and sinus pressure. Negative for dental problem, ear pain, hearing loss, sore throat, tinnitus, trouble swallowing and voice change.   Eyes: Negative for pain, discharge and visual disturbance.  Respiratory: Positive for cough. Negative for chest tightness, wheezing and stridor.   Cardiovascular: Negative for chest pain, palpitations and leg swelling.  Gastrointestinal: Negative for nausea, vomiting, abdominal pain, diarrhea, constipation, blood in stool and abdominal distention.  Genitourinary: Negative for urgency, hematuria, flank pain, discharge, difficulty urinating and genital sores.  Musculoskeletal: Negative for arthralgias, back pain, gait problem, joint swelling, myalgias and neck stiffness.  Skin: Negative for rash.  Neurological: Negative for dizziness, syncope, speech difficulty, weakness, numbness and headaches.  Hematological: Negative for adenopathy. Does not bruise/bleed easily.  Psychiatric/Behavioral: Negative for behavioral problems and dysphoric mood. The patient is not nervous/anxious.        Objective:   Physical Exam  Constitutional: He is oriented to person, place, and time. He appears well-developed.  HENT:  Head: Normocephalic.  Right  Ear: External ear normal.  Left Ear: External ear normal.  Eyes: Conjunctivae and EOM are normal.  Neck: Normal range of motion.  Cardiovascular: Normal rate and normal heart sounds.   Pulmonary/Chest: Breath sounds normal.  Abdominal: Bowel sounds are normal.  Musculoskeletal: Normal range of motion. He exhibits no edema and no tenderness.  Neurological: He is alert and oriented to person, place, and time.  Psychiatric: He has a normal mood and affect. His behavior is normal.          Assessment &  Plan:   Resolving URI versus allergic rhinitis.  Total smoking cessation encouraged.  We'll treat with dopoMedrol and observed   Schedule CPX  Return here as needed  Total smoking cessation encouraged

## 2014-03-19 NOTE — Patient Instructions (Addendum)
Take over-the-counter expectorants and cough medications such as  Mucinex DM.  Call if there is no improvement in 5 to 7 days or if  you develop worsening cough, fever, or new symptoms, such as shortness of breath or chest pain.  Smoking tobacco is very bad for your health. You should stop smoking immediately.  Consider daily, Flonase

## 2014-09-14 ENCOUNTER — Encounter: Payer: Self-pay | Admitting: Internal Medicine

## 2014-09-14 ENCOUNTER — Ambulatory Visit (INDEPENDENT_AMBULATORY_CARE_PROVIDER_SITE_OTHER): Payer: BLUE CROSS/BLUE SHIELD | Admitting: Internal Medicine

## 2014-09-14 VITALS — BP 120/80 | HR 72 | Temp 98.3°F | Resp 20 | Ht 71.5 in | Wt 223.0 lb

## 2014-09-14 DIAGNOSIS — M549 Dorsalgia, unspecified: Secondary | ICD-10-CM

## 2014-09-14 DIAGNOSIS — F411 Generalized anxiety disorder: Secondary | ICD-10-CM

## 2014-09-14 DIAGNOSIS — M5489 Other dorsalgia: Secondary | ICD-10-CM

## 2014-09-14 MED ORDER — ALPRAZOLAM 0.5 MG PO TABS: 0.5000 mg | ORAL_TABLET | Freq: Two times a day (BID) | ORAL | Status: DC | PRN

## 2014-09-14 MED ORDER — HYDROCODONE-ACETAMINOPHEN 5-325 MG PO TABS: 1.0000 | ORAL_TABLET | Freq: Four times a day (QID) | ORAL | Status: DC | PRN

## 2014-09-14 MED ORDER — METHYLPREDNISOLONE ACETATE 80 MG/ML IJ SUSP
80.0000 mg | Freq: Once | INTRAMUSCULAR | Status: AC
  Administered 2014-09-14: 80 mg via INTRAMUSCULAR

## 2014-09-14 NOTE — Patient Instructions (Signed)
Back Injury Prevention Back injuries can be extremely painful and difficult to heal. After having one back injury, you are much more likely to experience another later on. It is important to learn how to avoid injuring or re-injuring your back. The following tips can help you to prevent a back injury. PHYSICAL FITNESS  Exercise regularly and try to develop good tone in your abdominal muscles. Your abdominal muscles provide a lot of the support needed by your back.  Do aerobic exercises (walking, jogging, biking, swimming) regularly.  Do exercises that increase balance and strength (tai chi, yoga) regularly. This can decrease your risk of falling and injuring your back.  Stretch before and after exercising.  Maintain a healthy weight. The more you weigh, the more stress is placed on your back. For every pound of weight, 10 times that amount of pressure is placed on the back. DIET  Talk to your caregiver about how much calcium and vitamin D you need per day. These nutrients help to prevent weakening of the bones (osteoporosis). Osteoporosis can cause broken (fractured) bones that lead to back pain.  Include good sources of calcium in your diet, such as dairy products, green, leafy vegetables, and products with calcium added (fortified).  Include good sources of vitamin D in your diet, such as milk and foods that are fortified with vitamin D.  Consider taking a nutritional supplement or a multivitamin if needed.  Stop smoking if you smoke. POSTURE  Sit and stand up straight. Avoid leaning forward when you sit or hunching over when you stand.  Choose chairs with good low back (lumbar) support.  If you work at a desk, sit close to your work so you do not need to lean over. Keep your chin tucked in. Keep your neck drawn back and elbows bent at a right angle. Your arms should look like the letter "L."  Sit high and close to the steering wheel when you drive. Add a lumbar support to your car  seat if needed.  Avoid sitting or standing in one position for too long. Take breaks to get up, stretch, and walk around at least once every hour. Take breaks if you are driving for long periods of time.  Sleep on your side with your knees slightly bent, or sleep on your back with a pillow under your knees. Do not sleep on your stomach. LIFTING, TWISTING, AND REACHING  Avoid heavy lifting, especially repetitive lifting. If you must do heavy lifting:  Stretch before lifting.  Work slowly.  Rest between lifts.  Use carts and dollies to move objects when possible.  Make several small trips instead of carrying 1 heavy load.  Ask for help when you need it.  Ask for help when moving big, awkward objects.  Follow these steps when lifting:  Stand with your feet shoulder-width apart.  Get as close to the object as you can. Do not try to pick up heavy objects that are far from your body.  Use handles or lifting straps if they are available.  Bend at your knees. Squat down, but keep your heels off the floor.  Keep your shoulders pulled back, your chin tucked in, and your back straight.  Lift the object slowly, tightening the muscles in your legs, abdomen, and buttocks. Keep the object as close to the center of your body as possible.  When you put a load down, use these same guidelines in reverse.  Do not:  Lift the object above your waist.    Twist at the waist while lifting or carrying a load. Move your feet if you need to turn, not your waist.  Bend over without bending at your knees.  Avoid reaching over your head, across a table, or for an object on a high surface. OTHER TIPS  Avoid wet floors and keep sidewalks clear of ice to prevent falls.  Do not sleep on a mattress that is too soft or too hard.  Keep items that are used frequently within easy reach.  Put heavier objects on shelves at waist level and lighter objects on lower or higher shelves.  Find ways to  decrease your stress, such as exercise, massage, or relaxation techniques. Stress can build up in your muscles. Tense muscles are more vulnerable to injury.  Seek treatment for depression or anxiety if needed. These conditions can increase your risk of developing back pain. SEEK MEDICAL CARE IF:  You injure your back.  You have questions about diet, exercise, or other ways to prevent back injuries. MAKE SURE YOU:  Understand these instructions.  Will watch your condition.  Will get help right away if you are not doing well or get worse. Document Released: 08/13/2004 Document Revised: 09/28/2011 Document Reviewed: 08/17/2011 [employer] Patient Information 2015 [employer]. This information is not intended to replace advice given to you by your health care provider. Make sure you discuss any questions you have with your health care provider. Lumbosacral Strain Lumbosacral strain is a strain of any of the parts that make up your lumbosacral vertebrae. Your lumbosacral vertebrae are the bones that make up the lower third of your backbone. Your lumbosacral vertebrae are held together by muscles and tough, fibrous tissue (ligaments).  CAUSES  A sudden blow to your back can cause lumbosacral strain. Also, anything that causes an excessive stretch of the muscles in the low back can cause this strain. This is typically seen when people exert themselves strenuously, fall, lift heavy objects, bend, or crouch repeatedly. RISK FACTORS  Physically demanding work.  Participation in pushing or pulling sports or sports that require a sudden twist of the back (tennis, golf, baseball).  Weight lifting.  Excessive lower back curvature.  Forward-tilted pelvis.  Weak back or abdominal muscles or both.  Tight hamstrings. SIGNS AND SYMPTOMS  Lumbosacral strain may cause pain in the area of your injury or pain that moves (radiates) down your leg.  DIAGNOSIS Your health care provider can often  diagnose lumbosacral strain through a physical exam. In some cases, you may need tests such as X-ray exams.  TREATMENT  Treatment for your lower back injury depends on many factors that your clinician will have to evaluate. However, most treatment will include the use of anti-inflammatory medicines. HOME CARE INSTRUCTIONS   Avoid hard physical activities (tennis, racquetball, waterskiing) if you are not in proper physical condition for it. This may aggravate or create problems.  If you have a back problem, avoid sports requiring sudden body movements. Swimming and walking are generally safer activities.  Maintain good posture.  Maintain a healthy weight.  For acute conditions, you may put ice on the injured area.  Put ice in a plastic bag.  Place a towel between your skin and the bag.  Leave the ice on for 20 minutes, 2-3 times a day.  When the low back starts healing, stretching and strengthening exercises may be recommended. SEEK MEDICAL CARE IF:  Your back pain is getting worse.  You experience severe back pain not relieved with medicines. SEEK   IMMEDIATE MEDICAL CARE IF:   You have numbness, tingling, weakness, or problems with the use of your arms or legs.  There is a change in bowel or bladder control.  You have increasing pain in any area of the body, including your belly (abdomen).  You notice shortness of breath, dizziness, or feel faint.  You feel sick to your stomach (nauseous), are throwing up (vomiting), or become sweaty.  You notice discoloration of your toes or legs, or your feet get very cold. MAKE SURE YOU:   Understand these instructions.  Will watch your condition.  Will get help right away if you are not doing well or get worse. Document Released: 04/15/2005 Document Revised: 07/11/2013 Document Reviewed: 02/22/2013 [facility] Patient Information 2015 [place], [place]. This information is not intended to replace advice given to you by your health care  provider. Make sure you discuss any questions you have with your health care provider.

## 2014-09-14 NOTE — Progress Notes (Signed)
Pre visit review using our clinic review tool, if applicable. No additional management support is needed unless otherwise documented below in the visit note. 

## 2014-09-14 NOTE — Progress Notes (Signed)
Subjective:    Patient ID: Justin Norris, male    DOB: 1970-12-27, 44 y.o.   MRN: [id number]  HPI  44 year old patient who is an avid golfer.  He presents with recurrence of right lumbar pain.  This is aggravated by twisting, bending, stooping, etc. pain is alleviated by rest.  No radicular symptoms. He has a history of back pain and also anxiety disorder.  Past Medical History  Diagnosis Date  . ANXIETY 07/15/2007  . BACK PAIN, RIGHT 07/10/2010  . Headache(784.0) 07/15/2007  . NEPHROLITHIASIS, HX OF 07/15/2007    History   Social History  . Marital Status: Single    Spouse Name: N/A  . Number of Children: 0  . Years of Education: N/A   Occupational History  . Data Technician    Social History Main Topics  . Smoking status: Current Some Day Smoker -- 20 years    Types: Cigarettes  . Smokeless tobacco: Never Used  . Alcohol Use: Yes     Comment: rarely  . Drug Use: No  . Sexual Activity: Not on file   Other Topics Concern  . Not on file   Social History Narrative    Past Surgical History  Procedure Laterality Date  . Hernia repair      [facility]    Family History  Problem Relation Age of Onset  . Heart disease Father     No Known Allergies  No current outpatient prescriptions on file prior to visit.   No current facility-administered medications on file prior to visit.    BP 120/80 mmHg  Pulse 72  Temp(Src) 98.3 F (36.8 C) (Oral)  Resp 20  Ht 5' 11.5" (1.816 m)  Wt 223 lb (101.152 kg)  BMI 30.67 kg/m2  SpO2 98%     Review of Systems  Constitutional: Negative for fever, chills, appetite change and fatigue.  HENT: Negative for congestion, dental problem, ear pain, hearing loss, sore throat, tinnitus, trouble swallowing and voice change.   Eyes: Negative for pain, discharge and visual disturbance.  Respiratory: Negative for cough, chest tightness, wheezing and stridor.   Cardiovascular: Negative for chest pain, palpitations and leg swelling.    Gastrointestinal: Negative for nausea, vomiting, abdominal pain, diarrhea, constipation, blood in stool and abdominal distention.  Genitourinary: Negative for urgency, hematuria, flank pain, discharge, difficulty urinating and genital sores.  Musculoskeletal: Positive for back pain. Negative for myalgias, joint swelling, arthralgias, gait problem and neck stiffness.  Skin: Negative for rash.  Neurological: Negative for dizziness, syncope, speech difficulty, weakness, numbness and headaches.  Hematological: Negative for adenopathy. Does not bruise/bleed easily.  Psychiatric/Behavioral: Negative for behavioral problems and dysphoric mood. The patient is not nervous/anxious.        Objective:   Physical Exam  Constitutional: He is oriented to person, place, and time. He appears well-developed.  HENT:  Head: Normocephalic.  Right Ear: External ear normal.  Left Ear: External ear normal.  Eyes: Conjunctivae and EOM are normal.  Neck: Normal range of motion.  Cardiovascular: Normal rate and normal heart sounds.   Pulmonary/Chest: Breath sounds normal.  Abdominal: Bowel sounds are normal.  Musculoskeletal: Normal range of motion. He exhibits no edema or tenderness.  Negative straight leg test Tight hamstrings Mild tenderness over the right lumbar area  Neurovascular intact Normal ankle flexion and extension  Neurological: He is alert and oriented to person, place, and time.  Psychiatric: He has a normal mood and affect. His behavior is normal.  Assessment & Plan:     Right lumbar strain.  Will treat with anti-inflammatories, analgesics, rest, and heat therapy Anxiety disorder.  Xanax refilled  CPX 6 months

## 2015-02-25 ENCOUNTER — Encounter: Payer: Self-pay | Admitting: Internal Medicine

## 2015-02-25 ENCOUNTER — Ambulatory Visit (INDEPENDENT_AMBULATORY_CARE_PROVIDER_SITE_OTHER): Payer: BLUE CROSS/BLUE SHIELD | Admitting: Internal Medicine

## 2015-02-25 VITALS — BP 120/80 | HR 80 | Temp 98.2°F | Resp 20 | Ht 71.5 in | Wt 210.0 lb

## 2015-02-25 DIAGNOSIS — Z720 Tobacco use: Secondary | ICD-10-CM

## 2015-02-25 DIAGNOSIS — M549 Dorsalgia, unspecified: Secondary | ICD-10-CM

## 2015-02-25 DIAGNOSIS — F17200 Nicotine dependence, unspecified, uncomplicated: Secondary | ICD-10-CM

## 2015-02-25 DIAGNOSIS — J441 Chronic obstructive pulmonary disease with (acute) exacerbation: Secondary | ICD-10-CM

## 2015-02-25 MED ORDER — AZITHROMYCIN 250 MG PO TABS: ORAL_TABLET | ORAL | Status: DC

## 2015-02-25 NOTE — Progress Notes (Signed)
Subjective:    Patient ID: Justin Norris, male    DOB: Aug 23, 1970, 44 y.o.   MRN: [id number]  HPI  44 year old patient who has a history of intermittent low back pain as well as anxiety disorder.  He has been ill for 2 weeks with head and chest congestion.  He has had some associated headaches.  He complains of sputum production but no active wheezing.  He continues to smoke.  Past Medical History  Diagnosis Date  . ANXIETY 07/15/2007  . BACK PAIN, RIGHT 07/10/2010  . Headache(784.0) 07/15/2007  . NEPHROLITHIASIS, HX OF 07/15/2007    History   Social History  . Marital Status: Single    Spouse Name: N/A  . Number of Children: 0  . Years of Education: N/A   Occupational History  . Data Technician    Social History Main Topics  . Smoking status: Current Some Day Smoker -- 20 years    Types: Cigarettes  . Smokeless tobacco: Never Used  . Alcohol Use: Yes     Comment: rarely  . Drug Use: No  . Sexual Activity: Not on file   Other Topics Concern  . Not on file   Social History Narrative    Past Surgical History  Procedure Laterality Date  . Hernia repair      LIH    Family History  Problem Relation Age of Onset  . Heart disease Father     No Known Allergies  Current Outpatient Prescriptions on File Prior to Visit  Medication Sig Dispense Refill  . ALPRAZolam (XANAX) 0.5 MG tablet Take 1 tablet (0.5 mg total) by mouth 2 (two) times daily as needed. 60 tablet 3  . HYDROcodone-acetaminophen (NORCO/VICODIN) 5-325 MG per tablet Take 1 tablet by mouth every 6 (six) hours as needed. 60 tablet 0   No current facility-administered medications on file prior to visit.    BP 120/80 mmHg  Pulse 80  Temp(Src) 98.2 F (36.8 C) (Oral)  Resp 20  Ht 5' 11.5" (1.816 m)  Wt 210 lb (95.255 kg)  BMI 28.88 kg/m2  SpO2 98%     Review of Systems  Constitutional: Positive for activity change, appetite change and fatigue. Negative for fever and chills.  HENT: Positive  for congestion and sinus pressure. Negative for dental problem, ear pain, hearing loss, sore throat, tinnitus, trouble swallowing and voice change.   Eyes: Negative for pain, discharge and visual disturbance.  Respiratory: Positive for cough and chest tightness. Negative for wheezing and stridor.   Cardiovascular: Negative for chest pain, palpitations and leg swelling.  Gastrointestinal: Negative for nausea, vomiting, abdominal pain, diarrhea, constipation, blood in stool and abdominal distention.  Genitourinary: Negative for urgency, hematuria, flank pain, discharge, difficulty urinating and genital sores.  Musculoskeletal: Negative for myalgias, back pain, joint swelling, arthralgias, gait problem and neck stiffness.  Skin: Negative for rash.  Neurological: Negative for dizziness, syncope, speech difficulty, weakness, numbness and headaches.  Hematological: Negative for adenopathy. Does not bruise/bleed easily.  Psychiatric/Behavioral: Negative for behavioral problems and dysphoric mood. The patient is not nervous/anxious.        Objective:   Physical Exam  Constitutional: He is oriented to person, place, and time. He appears well-developed.  HENT:  Head: Normocephalic.  Right Ear: External ear normal.  Left Ear: External ear normal.  Eyes: Conjunctivae and EOM are normal.  Neck: Normal range of motion.  Cardiovascular: Normal rate and normal heart sounds.   Pulmonary/Chest: Effort normal. No respiratory distress. He has  no wheezes.  Diffuse coarse bilateral rhonchi  Abdominal: Bowel sounds are normal.  Musculoskeletal: Normal range of motion. He exhibits no edema or tenderness.  Neurological: He is alert and oriented to person, place, and time.  Psychiatric: He has a normal mood and affect. His behavior is normal.          Assessment & Plan:   Exacerbation of COPD Ongoing tobacco use Low back pain, stable  We'll treat with azithromycin and expectorants and rest Total  smoking cessation encouraged Will return if unimproved or if he develops any wheezing

## 2015-02-25 NOTE — Patient Instructions (Addendum)
Take over-the-counter expectorants and cough medications such as  Mucinex DM.  Call if there is no improvement in 5 to 7 days or if  you develop worsening cough, fever, or new symptoms, such as shortness of breath or chest pain.  HOME CARE INSTRUCTIONS  Get plenty of rest.  Drink enough fluids to keep your urine clear or pale yellow (unless you have a medical condition that requires fluid restriction). Increasing fluids may help thin your respiratory secretions (sputum) and reduce chest congestion, and it will prevent dehydration.  Take medicines only as directed by your health care provider.  If you were prescribed an antibiotic medicine, finish it all even if you start to feel better.  Avoid smoking and secondhand smoke. Exposure to cigarette smoke or irritating chemicals will make bronchitis worse. If you are a smoker, consider using nicotine gum or skin patches to help control withdrawal symptoms. Quitting smoking will help your lungs heal faster.  Reduce the chances of another bout of acute bronchitis by washing your hands frequently, avoiding people with cold symptoms, and trying not to touch your hands to your mouth, nose, or eyes.    Take your antibiotic as prescribed until ALL of it is gone, but stop if you develop a rash, swelling, or any side effects of the medication.  Contact our office as soon as possible if  there are side effects of the medication.  Smoking tobacco is very bad for your health. You should stop smoking immediately.

## 2015-02-28 ENCOUNTER — Telehealth: Payer: Self-pay | Admitting: Internal Medicine

## 2015-02-28 NOTE — Telephone Encounter (Signed)
Please advise 

## 2015-02-28 NOTE — Telephone Encounter (Signed)
Pt req a refill of the following med he said he given only 6 pills that told him to take twice a day  azithromycin (ZITHROMAX) 250 MG tablet  Can be called back at [phone]

## 2015-02-28 NOTE — Telephone Encounter (Signed)
Pt was seen on 8-8 for bronchitis and needs another round of abx zpak call into [facility] [address]/

## 2015-02-28 NOTE — Telephone Encounter (Signed)
Called and spoke with patient. Educated around MD K's visit notes stating if patient feels symptoms have worsen after 5 to 7 days of taking Zpak to call back. Patient states symptoms have not gotten worse, he just thought symptoms would have improved. Will call back Monday if symptoms worsen or of SHOB/chest pain.

## 2015-03-04 NOTE — Telephone Encounter (Signed)
Further antibiotics not needed.  Azithromycin is still in his system for several more days

## 2015-03-04 NOTE — Telephone Encounter (Signed)
Spoke to pt, told him no Further antibiotics are needed. Azithromycin is still in his system for several more days per Dr.K Pt verbalized understanding and stated has started Mucinex and is feeling somewhat better. Told pt if not feeling better by end of week please call. Pt verbalized understanding.

## 2015-05-29 ENCOUNTER — Ambulatory Visit (INDEPENDENT_AMBULATORY_CARE_PROVIDER_SITE_OTHER): Payer: 59 | Admitting: Family Medicine

## 2015-05-29 ENCOUNTER — Encounter: Payer: Self-pay | Admitting: Family Medicine

## 2015-05-29 VITALS — BP 118/83 | HR 82 | Temp 98.4°F | Ht 71.5 in | Wt 210.0 lb

## 2015-05-29 DIAGNOSIS — N529 Male erectile dysfunction, unspecified: Secondary | ICD-10-CM

## 2015-05-29 LAB — BASIC METABOLIC PANEL
BUN: 12 mg/dL (ref 6–23)
CALCIUM: 10 mg/dL (ref 8.4–10.5)
CO2: 29 mEq/L (ref 19–32)
Chloride: 102 mEq/L (ref 96–112)
Creatinine, Ser: 1 mg/dL (ref 0.40–1.50)
GFR: 85.51 mL/min (ref >60.00)
GLUCOSE: 99 mg/dL (ref 70–99)
POTASSIUM: 4.2 meq/L (ref 3.5–5.1)
Sodium: 141 mEq/L (ref 135–145)

## 2015-05-29 LAB — CBC WITH DIFFERENTIAL/PLATELET
BASOS PCT: 0.5 % (ref 0.0–3.0)
Basophils Absolute: 0 10*3/uL (ref 0.0–0.1)
EOS ABS: 0.1 10*3/uL (ref 0.0–0.7)
EOS PCT: 0.8 % (ref 0.0–5.0)
HCT: 46 % (ref 39.0–52.0)
HEMOGLOBIN: 15.3 g/dL (ref 13.0–17.0)
LYMPHS ABS: 1.4 10*3/uL (ref 0.7–4.0)
Lymphocytes Relative: 20.1 % (ref 12.0–46.0)
MCHC: 33.3 g/dL (ref 30.0–36.0)
MCV: 89.7 fl (ref 78.0–100.0)
MONO ABS: 0.4 10*3/uL (ref 0.1–1.0)
Monocytes Relative: 5.1 % (ref 3.0–12.0)
NEUTROS PCT: 73.5 % (ref 43.0–77.0)
Neutro Abs: 5.2 10*3/uL (ref 1.4–7.7)
Platelets: 264 10*3/uL (ref 150.0–400.0)
RBC: 5.13 Mil/uL (ref 4.22–5.81)
RDW: 13.8 % (ref 11.5–15.5)
WBC: 7.1 10*3/uL (ref 4.0–10.5)

## 2015-05-29 LAB — TSH: TSH: 1.34 u[IU]/mL (ref 0.35–4.50)

## 2015-05-29 LAB — TESTOSTERONE: TESTOSTERONE: 275.88 ng/dL — AB (ref 300.00–890.00)

## 2015-05-29 MED ORDER — TADALAFIL 20 MG PO TABS: 20.0000 mg | ORAL_TABLET | Freq: Every day | ORAL | Status: DC | PRN

## 2015-05-29 NOTE — Progress Notes (Signed)
   Subjective:    Patient ID: Justin Norris, male    DOB: 1971/05/28, 44 y.o.   MRN: [id number]  HPI Here for help with erections. He had gone a year with no sexual partners, then 7 weeks ago met someone with whom he is trying sex again. Lately he has had trouble getting and maintaining full erections. He says his desire is as strong as ever. He feels good in general. He gets plenty of sleep, and he denies much in the way of stress in his life. 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    Review of Systems  Constitutional: Positive for fatigue.  Respiratory: Negative.   Cardiovascular: Negative.   Endocrine: Negative.   Neurological: Negative.   Psychiatric/Behavioral: Negative.        Objective:   Physical Exam  Constitutional: He is oriented to person, place, and time. He appears well-developed and well-nourished.  Neck: No thyromegaly present.  Cardiovascular: Normal rate, regular rhythm, normal heart sounds and intact distal pulses.   Pulmonary/Chest: Effort normal and breath sounds normal.  Musculoskeletal: He exhibits no edema.  Lymphadenopathy:    He has no cervical adenopathy.  Neurological: He is alert and oriented to person, place, and time.  Psychiatric: He has a normal mood and affect. His behavior is normal. Thought content normal.          Assessment & Plan:  Erectile dysfunction. We will screen for metabolic causes with labs today. Given samples to try Cialis.

## 2015-05-29 NOTE — Progress Notes (Signed)
Pre visit review using our clinic review tool, if applicable. No additional management support is needed unless otherwise documented below in the visit note. 

## 2015-06-04 MED ORDER — TADALAFIL 20 MG PO TABS: 20.0000 mg | ORAL_TABLET | Freq: Every day | ORAL | Status: DC | PRN

## 2015-06-04 NOTE — Addendum Note (Signed)
Addended by: Aniceto BossNIMMONS, Marca Gadsby A on: 06/04/2015 12:22 PM   Modules accepted: Orders

## 2015-10-11 ENCOUNTER — Encounter: Payer: Self-pay | Admitting: Internal Medicine

## 2015-10-12 ENCOUNTER — Ambulatory Visit (INDEPENDENT_AMBULATORY_CARE_PROVIDER_SITE_OTHER): Payer: 59 | Admitting: Family Medicine

## 2015-10-12 ENCOUNTER — Encounter: Payer: Self-pay | Admitting: Family Medicine

## 2015-10-12 VITALS — BP 118/80 | HR 83 | Temp 98.3°F | Wt 190.0 lb

## 2015-10-12 DIAGNOSIS — J209 Acute bronchitis, unspecified: Secondary | ICD-10-CM | POA: Diagnosis not present

## 2015-10-12 DIAGNOSIS — J111 Influenza due to unidentified influenza virus with other respiratory manifestations: Secondary | ICD-10-CM

## 2015-10-12 DIAGNOSIS — R69 Illness, unspecified: Principal | ICD-10-CM

## 2015-10-12 MED ORDER — OSELTAMIVIR PHOSPHATE 75 MG PO CAPS: 75.0000 mg | ORAL_CAPSULE | Freq: Two times a day (BID) | ORAL | Status: DC

## 2015-10-12 MED ORDER — PREDNISONE 20 MG PO TABS: ORAL_TABLET | ORAL | Status: DC

## 2015-10-12 MED ORDER — HYDROCODONE-HOMATROPINE 5-1.5 MG/5ML PO SYRP: ORAL_SOLUTION | ORAL | Status: DC

## 2015-10-12 NOTE — Patient Instructions (Signed)
Get otc generic robitussin DM OR Mucinex DM and use as directed on the packaging for cough and congestion. Use otc generic saline nasal spray 2-3 times per day to irrigate/moisturize your nasal passages.   

## 2015-10-12 NOTE — Progress Notes (Signed)
Pre visit review using our clinic review tool, if applicable. No additional management support is needed unless otherwise documented below in the visit note. 

## 2015-10-12 NOTE — Progress Notes (Signed)
OFFICE VISIT  10/13/2015   CC:  Chief Complaint  Patient presents with  . Cough    non productive w/ chest congestion-nasal mucus yellow in color--ear fullness--denies any other Sx--x 1 day--pt has taken OTC Guafenesin and Nyquil w/ minimal relief     HPI:    Patient is a [age] y.o. Caucasian male who presents for cough. Onset yesterday afternoon with hacky cough.  Felt febrile but no temp checked.  +ST, +runny and stuffy nose, achy all over, fatigued.  No n/v/d.  No wheezing or SOB. Former smoker--quit 8 mo ago.   Past Medical History  Diagnosis Date  . ANXIETY 07/15/2007  . BACK PAIN, RIGHT 07/10/2010  . Headache(784.0) 07/15/2007  . NEPHROLITHIASIS, HX OF 07/15/2007    Past Surgical History  Procedure Laterality Date  . [facility]    Outpatient Prescriptions Prior to Visit  Medication Sig Dispense Refill  . ALPRAZolam (XANAX) 0.5 MG tablet Take 1 tablet (0.5 mg total) by mouth 2 (two) times daily as needed. 60 tablet 3  . HYDROcodone-acetaminophen (NORCO/VICODIN) 5-325 MG per tablet Take 1 tablet by mouth every 6 (six) hours as needed. 60 tablet 0  . tadalafil (CIALIS) 20 MG tablet Take 1 tablet (20 mg total) by mouth daily as needed for erectile dysfunction. 30 tablet 3   No facility-administered medications prior to visit.    No Known Allergies  ROS As per HPI  PE: Blood pressure 118/80, pulse 83, temperature 98.3 F (36.8 C), temperature source Oral, weight 190 lb (86.183 kg), SpO2 98 %. VS: noted--normal. Gen: alert, NAD, NONTOXIC APPEARING. HEENT: eyes without injection, drainage, or swelling.  Ears: EACs clear, TMs with normal light reflex and landmarks.  Nose: Clear rhinorrhea, with some dried, crusty exudate adherent to mildly injected mucosa.  No purulent d/c.  No paranasal sinus TTP.  No facial swelling.  Throat and mouth without focal lesion.  No pharyngial swelling, erythema, or exudate.   Neck: supple, no LAD.   LUNGS: CTA bilat,  nonlabored resps.  Frequent post-exhalation coughing. CV: RRR, no m/r/g. EXT: no c/c/e SKIN: no rash   LABS:  None today  IMPRESSION AND PLAN:  1) Influenza-like illness: Tamiflu 75mg  bid x 5d.  2) Acute bronchitis: Prednisone 40 mg qd x 5d. Hycodan syrup 1-2 tsp qhs prn cough, #120 ml.   Get otc generic robitussin DM OR Mucinex DM and use as directed on the packaging for cough and congestion. Use otc generic saline nasal spray 2-3 times per day to irrigate/moisturize your nasal passages.  An After Visit Summary was printed and given to the patient.  FOLLOW UP: Return if symptoms worsen or fail to improve.  Signed:  Santiago BumpersPhil Cambrie Sonnenfeld, MD           10/13/2015

## 2015-10-21 ENCOUNTER — Encounter: Payer: Self-pay | Admitting: Internal Medicine

## 2015-10-21 ENCOUNTER — Ambulatory Visit (INDEPENDENT_AMBULATORY_CARE_PROVIDER_SITE_OTHER): Payer: 59 | Admitting: Internal Medicine

## 2015-10-21 VITALS — BP 110/80 | HR 83 | Temp 98.6°F | Resp 20 | Ht 71.5 in | Wt 193.0 lb

## 2015-10-21 DIAGNOSIS — M5489 Other dorsalgia: Secondary | ICD-10-CM | POA: Diagnosis not present

## 2015-10-21 DIAGNOSIS — J069 Acute upper respiratory infection, unspecified: Secondary | ICD-10-CM | POA: Diagnosis not present

## 2015-10-21 DIAGNOSIS — B9789 Other viral agents as the cause of diseases classified elsewhere: Secondary | ICD-10-CM

## 2015-10-21 DIAGNOSIS — F411 Generalized anxiety disorder: Secondary | ICD-10-CM

## 2015-10-21 MED ORDER — HYDROCODONE-ACETAMINOPHEN 5-325 MG PO TABS: 1.0000 | ORAL_TABLET | Freq: Four times a day (QID) | ORAL | Status: DC | PRN

## 2015-10-21 MED ORDER — HYDROCODONE-HOMATROPINE 5-1.5 MG/5ML PO SYRP: ORAL_SOLUTION | ORAL | Status: DC

## 2015-10-21 MED ORDER — ALPRAZOLAM 0.5 MG PO TABS: 0.5000 mg | ORAL_TABLET | Freq: Two times a day (BID) | ORAL | Status: DC | PRN

## 2015-10-21 NOTE — Progress Notes (Signed)
Pre visit review using our clinic review tool, if applicable. No additional management support is needed unless otherwise documented below in the visit note. 

## 2015-10-21 NOTE — Progress Notes (Deleted)
   Subjective:    Patient ID: Justin Norris, male    DOB: 05-Oct-1970, 45 y.o.   MRN: [id number]  HPI  Wt Readings from Last 3 Encounters:  10/21/15 193 lb (87.544 kg)  10/12/15 190 lb (86.183 kg)  05/29/15 210 lb (95.255 kg)      Review of Systems     Objective:   Physical Exam        Assessment & Plan:

## 2015-10-21 NOTE — Progress Notes (Signed)
Subjective:    Patient ID: Justin Norris, male    DOB: [birth year], 45 y.o.   MRN: [id number]  HPI  45 year old patient who has history of chronic low back pain as well as anxiety disorder.  He was seen about a week ago with a URI.  He was treated with Tamiflu for a flu syndrome as well as a prednisone taper.  He has improved but still having some chest congestion, cough, and general sense of unwellness.  He is on guaifenesin.  No fever He discontinued tobacco use about 7 months ago.  Past Medical History  Diagnosis Date  . ANXIETY 07/15/2007  . BACK PAIN, RIGHT 07/10/2010  . Headache(784.0) 07/15/2007  . NEPHROLITHIASIS, HX OF 07/15/2007    Social History   Social History  . Marital Status: Single    Spouse Name: N/A  . Number of Children: 0  . Years of Education: N/A   Occupational History  . Data Technician    Social History Main Topics  . Smoking status: Former Smoker -- 20 years    Types: Cigarettes    Quit date: 07/03/2014  . Smokeless tobacco: Never Used  . Alcohol Use: No     Comment: rare  . Drug Use: No  . Sexual Activity: Not on file   Other Topics Concern  . Not on file   Social History Narrative    Past Surgical History  Procedure Laterality Date  . Hernia repair      [facility]    Family History  Problem Relation Age of Onset  . Heart disease Father     No Known Allergies  Current Outpatient Prescriptions on File Prior to Visit  Medication Sig Dispense Refill  . ALPRAZolam (XANAX) 0.5 MG tablet Take 1 tablet (0.5 mg total) by mouth 2 (two) times daily as needed. 60 tablet 3  . HYDROcodone-acetaminophen (NORCO/VICODIN) 5-325 MG per tablet Take 1 tablet by mouth every 6 (six) hours as needed. 60 tablet 0  . tadalafil (CIALIS) 20 MG tablet Take 1 tablet (20 mg total) by mouth daily as needed for erectile dysfunction. 30 tablet 3  . HYDROcodone-homatropine (HYCODAN) 5-1.5 MG/5ML syrup 1-2 tsp po qhs prn cough (Patient not taking: Reported on  10/21/2015) 120 mL 0   No current facility-administered medications on file prior to visit.    BP 110/80 mmHg  Pulse 83  Temp(Src) 98.6 F (37 C) (Oral)  Resp 20  Ht 5' 11.5" (1.816 m)  Wt 193 lb (87.544 kg)  BMI 26.55 kg/m2  SpO2 98%     Review of Systems  Constitutional: Positive for activity change, appetite change and fatigue. Negative for fever and chills.  HENT: Positive for congestion and sore throat. Negative for dental problem, ear pain, hearing loss, tinnitus, trouble swallowing and voice change.   Eyes: Negative for pain, discharge and visual disturbance.  Respiratory: Positive for cough. Negative for chest tightness, wheezing and stridor.   Cardiovascular: Negative for chest pain, palpitations and leg swelling.  Gastrointestinal: Negative for nausea, vomiting, abdominal pain, diarrhea, constipation, blood in stool and abdominal distention.  Genitourinary: Negative for urgency, hematuria, flank pain, discharge, difficulty urinating and genital sores.  Musculoskeletal: Negative for myalgias, back pain, joint swelling, arthralgias, gait problem and neck stiffness.  Skin: Negative for rash.  Neurological: Negative for dizziness, syncope, speech difficulty, weakness, numbness and headaches.  Hematological: Negative for adenopathy. Does not bruise/bleed easily.  Psychiatric/Behavioral: Negative for behavioral problems and dysphoric mood. The patient is not nervous/anxious.  Objective:   Physical Exam  Constitutional: He is oriented to person, place, and time. He appears well-developed.  HENT:  Head: Normocephalic.  Right Ear: External ear normal.  Left Ear: External ear normal.  Eyes: Conjunctivae and EOM are normal.  Neck: Normal range of motion.  Cardiovascular: Normal rate and normal heart sounds.   Pulmonary/Chest: Breath sounds normal. No respiratory distress. He has no wheezes. He has no rales.  Abdominal: Bowel sounds are normal.  Musculoskeletal: Normal  range of motion. He exhibits no edema or tenderness.  Neurological: He is alert and oriented to person, place, and time.  Psychiatric: He has a normal mood and affect. His behavior is normal.          Assessment & Plan:   Viral URI with cough.  Will treat symptomatically Anxiety disorder.  Alprazolam refilled History of mild testosterone deficiency/ED largely  resolved

## 2015-10-21 NOTE — Patient Instructions (Signed)
Acute bronchitis symptoms  are generally not helped by antibiotics.  Take over-the-counter expectorants and cough medications such as  Mucinex DM.  Call if there is no improvement in 5 to 7 days or if  you develop worsening cough, fever, or new symptoms, such as shortness of breath or chest pain.  HOME CARE INSTRUCTIONS  Drink plenty of water. Water helps thin the mucus so your sinuses can drain more easily.  Use a humidifier.  Inhale steam 3-4 times a day (for example, sit in the bathroom with the shower running).  Apply a warm, moist washcloth to your face 3-4 times a day, or as directed by your health care provider.  Use saline nasal sprays to help moisten and clean your sinuses.  

## 2016-02-11 ENCOUNTER — Ambulatory Visit (INDEPENDENT_AMBULATORY_CARE_PROVIDER_SITE_OTHER): Payer: Self-pay | Admitting: *Deleted

## 2016-02-11 DIAGNOSIS — Z111 Encounter for screening for respiratory tuberculosis: Secondary | ICD-10-CM

## 2016-02-14 ENCOUNTER — Encounter: Payer: Self-pay | Admitting: *Deleted

## 2016-02-14 LAB — TB SKIN TEST
Induration: 0 mm
TB Skin Test: NEGATIVE

## 2016-03-02 ENCOUNTER — Other Ambulatory Visit: Payer: Self-pay | Admitting: Internal Medicine

## 2016-03-02 ENCOUNTER — Telehealth: Payer: Self-pay | Admitting: Internal Medicine

## 2016-03-02 MED ORDER — PODOFILOX 0.5 % EX GEL: Freq: Two times a day (BID) | CUTANEOUS | 0 refills | Status: DC

## 2016-03-02 NOTE — Telephone Encounter (Signed)
Notify patient.  Prescription called into his drugstore

## 2016-03-02 NOTE — Telephone Encounter (Signed)
° °  Pt call to ask if Dr Kirtland BouchardK will RX him the following med, IT IS NOT SHOWING ON HIS MED LIST.   PODOFILOX   [facility] [address]

## 2016-03-02 NOTE — Telephone Encounter (Signed)
Pt requesting Podofilox, medication is not listed on medication list.  Pt last seen in office 10/21/15    Okay to fill? And if so directions to be given.

## 2016-03-03 NOTE — Telephone Encounter (Signed)
Called and informed pt that prescription has been called into his pharmacy.

## 2016-04-13 ENCOUNTER — Ambulatory Visit (INDEPENDENT_AMBULATORY_CARE_PROVIDER_SITE_OTHER): Payer: BLUE CROSS/BLUE SHIELD | Admitting: Internal Medicine

## 2016-04-13 ENCOUNTER — Encounter: Payer: Self-pay | Admitting: Internal Medicine

## 2016-04-13 VITALS — BP 106/70 | HR 51 | Temp 98.2°F | Resp 20 | Ht 71.5 in | Wt 192.2 lb

## 2016-04-13 DIAGNOSIS — M5441 Lumbago with sciatica, right side: Secondary | ICD-10-CM

## 2016-04-13 MED ORDER — HYDROCODONE-ACETAMINOPHEN 5-325 MG PO TABS: 1.0000 | ORAL_TABLET | Freq: Four times a day (QID) | ORAL | 0 refills | Status: DC | PRN

## 2016-04-13 MED ORDER — METHYLPREDNISOLONE ACETATE 80 MG/ML IJ SUSP
80.0000 mg | Freq: Once | INTRAMUSCULAR | Status: AC
  Administered 2016-04-13: 80 mg via INTRAMUSCULAR

## 2016-04-13 NOTE — Progress Notes (Signed)
   Subjective:    Patient ID: Justin Norris, male    DOB: [birth year], 45 y.o.   MRN: [id number]  HPI  45 year old patient who presents with a one-week history of lumbar pain.  Has long history of intermittent flares of low back pain.  He has been using naproxen but he states this has not been helpful or well tolerated.  Denies any radicular symptoms. No lumbar radiographs on file  Past Medical History:  Diagnosis Date  . ANXIETY 07/15/2007  . BACK PAIN, RIGHT 07/10/2010  . Headache(784.0) 07/15/2007  . NEPHROLITHIASIS, HX OF 07/15/2007     Social History   Social History  . Marital status: Single    Spouse name: N/A  . Number of children: 0  . Years of education: N/A   Occupational History  . Data Technician [employer]   Social History Main Topics  . Smoking status: Former Smoker    Years: 20.00    Types: Cigarettes    Quit date: 07/03/2014  . Smokeless tobacco: Never Used  . Alcohol use No     Comment: rare  . Drug use: No  . Sexual activity: Not on file   Other Topics Concern  . Not on file   Social History Narrative  . No narrative on file    Past Surgical History:  Procedure Laterality Date  . HERNIA REPAIR     [facility]    Family History  Problem Relation Age of Onset  . Heart disease Father     No Known Allergies  Current Outpatient Prescriptions on File Prior to Visit  Medication Sig Dispense Refill  . ALPRAZolam (XANAX) 0.5 MG tablet Take 1 tablet (0.5 mg total) by mouth 2 (two) times daily as needed. 60 tablet 3  . podofilox (CONDYLOX) 0.5 % gel Apply topically 2 (two) times daily. 3.5 g 0  . tadalafil (CIALIS) 20 MG tablet Take 1 tablet (20 mg total) by mouth daily as needed for erectile dysfunction. 30 tablet 3   No current facility-administered medications on file prior to visit.     BP 106/70 (BP Location: Left Arm, Patient Position: Sitting, Cuff Size: Normal)   Pulse (!) 51   Temp 98.2 F (36.8 C) (Oral)   Resp 20   Ht 5' 11.5"  (1.816 m)   Wt 192 lb 4 oz (87.2 kg)   SpO2 98%   BMI 26.44 kg/m     Review of Systems  Constitutional: Negative.   Musculoskeletal: Positive for back pain. Negative for gait problem.       Objective:   Physical Exam  Constitutional: He appears well-developed and well-nourished. No distress.  Musculoskeletal:  Negative straight leg test Hamstrings very tight No lumbar tenderness          Assessment & Plan:   Exacerbation of low back pain.  Will treat with Depo-Medrol.  Trial of ibuprofen recommended when necessary.  Vicodin refilled  CPX next year as scheduled  Rogelia BogaKWIATKOWSKI,PETER FRANK

## 2016-04-13 NOTE — Patient Instructions (Signed)
Most patients with low back pain will improve with time over the next two to 6 weeks.  Keep active but avoid any activities that cause pain.  Apply moist heat to the low back area several times daily.  Take 400-600 mg of ibuprofen ( Advil, Motrin) with food every 4 to 6 hours as needed for pain relief

## 2016-07-10 ENCOUNTER — Telehealth: Payer: Self-pay | Admitting: Internal Medicine

## 2016-07-10 NOTE — Telephone Encounter (Signed)
Pt would like to have a order to have his blood drawn to find out his blood type. Pt is aware md out of office

## 2016-07-16 NOTE — Telephone Encounter (Signed)
Spoke to pt, told him I would suggest try [facility] to have drawn. Pt said he called them and they would not do it without him donating. Told him to call insurance and see if they will cover to have blood type done if so can order. Pt verbalized understanding and said has an appt coming up with Dr.K. Told him okay can do them if okay with insurance. Pt verbalized understanding.

## 2016-07-27 ENCOUNTER — Other Ambulatory Visit: Payer: BLUE CROSS/BLUE SHIELD

## 2016-08-03 ENCOUNTER — Ambulatory Visit: Payer: BLUE CROSS/BLUE SHIELD | Admitting: Internal Medicine

## 2016-08-05 ENCOUNTER — Ambulatory Visit: Payer: BLUE CROSS/BLUE SHIELD | Admitting: Internal Medicine

## 2016-08-10 ENCOUNTER — Ambulatory Visit (INDEPENDENT_AMBULATORY_CARE_PROVIDER_SITE_OTHER): Payer: BLUE CROSS/BLUE SHIELD | Admitting: Internal Medicine

## 2016-08-10 ENCOUNTER — Encounter: Payer: Self-pay | Admitting: Internal Medicine

## 2016-08-10 VITALS — BP 117/70 | HR 98 | Temp 98.3°F | Ht 70.5 in | Wt 192.4 lb

## 2016-08-10 DIAGNOSIS — Z0000 Encounter for general adult medical examination without abnormal findings: Secondary | ICD-10-CM | POA: Diagnosis not present

## 2016-08-10 LAB — HEPATIC FUNCTION PANEL
ALT: 39 U/L (ref 0–53)
AST: 21 U/L (ref 0–37)
Albumin: 4.5 g/dL (ref 3.5–5.2)
Alkaline Phosphatase: 76 U/L (ref 39–117)
BILIRUBIN DIRECT: 0.1 mg/dL (ref 0.0–0.3)
BILIRUBIN TOTAL: 0.7 mg/dL (ref 0.2–1.2)
Total Protein: 6.8 g/dL (ref 6.0–8.3)

## 2016-08-10 LAB — CBC WITH DIFFERENTIAL/PLATELET
Basophils Absolute: 0 10*3/uL (ref 0.0–0.1)
Basophils Relative: 0.6 % (ref 0.0–3.0)
EOS ABS: 0.2 10*3/uL (ref 0.0–0.7)
EOS PCT: 2.7 % (ref 0.0–5.0)
HEMATOCRIT: 44.3 % (ref 39.0–52.0)
HEMOGLOBIN: 15.1 g/dL (ref 13.0–17.0)
LYMPHS PCT: 20.9 % (ref 12.0–46.0)
Lymphs Abs: 1.6 10*3/uL (ref 0.7–4.0)
MCHC: 34.2 g/dL (ref 30.0–36.0)
MCV: 89.8 fl (ref 78.0–100.0)
MONO ABS: 0.4 10*3/uL (ref 0.1–1.0)
Monocytes Relative: 5.1 % (ref 3.0–12.0)
Neutro Abs: 5.5 10*3/uL (ref 1.4–7.7)
Neutrophils Relative %: 70.7 % (ref 43.0–77.0)
Platelets: 232 10*3/uL (ref 150.0–400.0)
RBC: 4.93 Mil/uL (ref 4.22–5.81)
RDW: 13.4 % (ref 11.5–15.5)
WBC: 7.8 10*3/uL (ref 4.0–10.5)

## 2016-08-10 LAB — BASIC METABOLIC PANEL
BUN: 15 mg/dL (ref 6–23)
CHLORIDE: 100 meq/L (ref 96–112)
CO2: 33 mEq/L — ABNORMAL HIGH (ref 19–32)
Calcium: 9.6 mg/dL (ref 8.4–10.5)
Creatinine, Ser: 1.1 mg/dL (ref 0.40–1.50)
GFR: 76.2 mL/min (ref >60.00)
GLUCOSE: 90 mg/dL (ref 70–99)
POTASSIUM: 4.7 meq/L (ref 3.5–5.1)
SODIUM: 139 meq/L (ref 135–145)

## 2016-08-10 LAB — POC URINALSYSI DIPSTICK (AUTOMATED)
Bilirubin, UA: NEGATIVE
Glucose, UA: NEGATIVE
Ketones, UA: NEGATIVE
Leukocytes, UA: NEGATIVE
Nitrite, UA: NEGATIVE
PH UA: 5.5
PROTEIN UA: NEGATIVE
RBC UA: NEGATIVE
SPEC GRAV UA: 1.02
Urobilinogen, UA: 0.2

## 2016-08-10 LAB — TSH: TSH: 1.2 u[IU]/mL (ref 0.35–4.50)

## 2016-08-10 LAB — LIPID PANEL
CHOL/HDL RATIO: 4
Cholesterol: 186 mg/dL (ref 0–200)
HDL: 45.3 mg/dL (ref >39.00)
LDL CALC: 111 mg/dL — AB (ref 0–99)
NonHDL: 140.24
Triglycerides: 148 mg/dL (ref 0.0–149.0)
VLDL: 29.6 mg/dL (ref 0.0–40.0)

## 2016-08-10 NOTE — Progress Notes (Signed)
Subjective:    Patient ID: Justin Norris, male    DOB: 1971/04/22, 46 y.o.   MRN: [id number]  HPI  46 year old patient who is seen today for a preventive health examination. He has a history of occasional flares of low back pain.  His history of ED anxiety disorder as well as ongoing tobacco use. In general doing well today.  Social history he is engaged to be married and his fiance is pregnant.  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  Past Medical History:  Diagnosis Date  . ANXIETY 07/15/2007  . BACK PAIN, RIGHT 07/10/2010  . Headache(784.0) 07/15/2007  . NEPHROLITHIASIS, HX OF 07/15/2007     Social History   Social History  . Marital status: Single    Spouse name: N/A  . Number of children: 0  . Years of education: N/A   Occupational History  . Data Technician [employer]   Social History Main Topics  . Smoking status: Former Smoker    Years: 20.00    Types: Cigarettes    Quit date: 07/03/2014  . Smokeless tobacco: Never Used  . Alcohol use No     Comment: rare  . Drug use: No  . Sexual activity: Not on file   Other Topics Concern  . Not on file   Social History Narrative  . No narrative on file    Past Surgical History:  Procedure Laterality Date  . HERNIA REPAIR     [facility]    Family History  Problem Relation Age of Onset  . Heart disease Father     No Known Allergies  Current Outpatient Prescriptions on File Prior to Visit  Medication Sig Dispense Refill  . ALPRAZolam (XANAX) 0.5 MG tablet Take 1 tablet (0.5 mg total) by mouth 2 (two) times daily as needed. 60 tablet 3  . HYDROcodone-acetaminophen (NORCO/VICODIN) 5-325 MG tablet Take 1 tablet by mouth every 6 (six) hours as needed. 60 tablet 0  . podofilox (CONDYLOX) 0.5 % gel Apply topically 2 (two) times daily. 3.5 g 0  . tadalafil (CIALIS) 20 MG tablet Take 1 tablet (20 mg total) by mouth daily as needed for erectile dysfunction. 30 tablet 3   No current  facility-administered medications on file prior to visit.     BP 117/70 (BP Location: Right Arm, Patient Position: Sitting, Cuff Size: Normal)   Pulse 98   Temp 98.3 F (36.8 C) (Oral)   Ht 5' 10.5" (1.791 m)   Wt 192 lb 6.4 oz (87.3 kg)   SpO2 98%   BMI 27.22 kg/m     Review of Systems  Constitutional: Negative for activity change, appetite change, chills, fatigue and fever.  HENT: Negative for congestion, dental problem, ear pain, hearing loss, mouth sores, rhinorrhea, sinus pressure, sneezing, tinnitus, trouble swallowing and voice change.   Eyes: Negative for photophobia, pain, redness and visual disturbance.  Respiratory: Negative for apnea, cough, choking, chest tightness, shortness of breath and wheezing.   Cardiovascular: Negative for chest pain, palpitations and leg swelling.  Gastrointestinal: Negative for abdominal distention, abdominal pain, anal bleeding, blood in stool, constipation, diarrhea, nausea, rectal pain and vomiting.  Genitourinary: Negative for decreased urine volume, difficulty urinating, discharge, dysuria, flank pain, frequency, genital sores, hematuria, penile swelling, scrotal swelling, testicular pain and urgency.  Musculoskeletal: Positive for back pain. Negative for arthralgias, gait problem, joint swelling, myalgias, neck pain and neck stiffness.  Skin: Negative for color change, rash and wound.  Neurological: Negative for  dizziness, tremors, seizures, syncope, facial asymmetry, speech difficulty, weakness, light-headedness, numbness and headaches.  Hematological: Negative for adenopathy. Does not bruise/bleed easily.  Psychiatric/Behavioral: Negative for agitation, behavioral problems, confusion, decreased concentration, dysphoric mood, hallucinations, self-injury, sleep disturbance and suicidal ideas. The patient is not nervous/anxious.        Objective:   Physical Exam  Constitutional: He appears well-developed and well-nourished.  HENT:  Head:  Normocephalic and atraumatic.  Right Ear: External ear normal.  Left Ear: External ear normal.  Nose: Nose normal.  Mouth/Throat: Oropharynx is clear and moist.  Eyes: Conjunctivae and EOM are normal. Pupils are equal, round, and reactive to light. No scleral icterus.  Neck: Normal range of motion. Neck supple. No JVD present. No thyromegaly present.  Cardiovascular: Regular rhythm, normal heart sounds and intact distal pulses.  Exam reveals no gallop and no friction rub.   No murmur heard. Pulmonary/Chest: Effort normal and breath sounds normal. He exhibits no tenderness.  Abdominal: Soft. Bowel sounds are normal. He exhibits no distension and no mass. There is no tenderness.  Genitourinary: Prostate normal and penis normal. Rectal exam shows guaiac negative stool.  Musculoskeletal: Normal range of motion. He exhibits no edema or tenderness.  Lymphadenopathy:    He has no cervical adenopathy.  Neurological: He is alert. He has normal reflexes. No cranial nerve deficit. Coordination normal.  Skin: Skin is warm and dry. No rash noted.  Psychiatric: He has a normal mood and affect. His behavior is normal.          Assessment & Plan:   Preventive health examination.  Will check screening lab Ongoing tobacco use.  Total smoking cessation encouraged Episodic low back pain  Follow-up one year or as needed Will review laboratory screen  Rogelia Boga

## 2016-08-10 NOTE — Progress Notes (Signed)
Pre visit review using our clinic review tool, if applicable. No additional management support is needed unless otherwise documented below in the visit note. 

## 2016-08-10 NOTE — Patient Instructions (Addendum)
Preventive Care 40-64 Years, Male Preventive care refers to lifestyle choices and visits with your health care provider that can promote health and wellness. What does preventive care include?  A yearly physical exam. This is also called an annual well check.  Dental exams once or twice a year.  Routine eye exams. Ask your health care provider how often you should have your eyes checked.  Personal lifestyle choices, including:  Daily care of your teeth and gums.  Regular physical activity.  Eating a healthy diet.  Avoiding tobacco and drug use.  Limiting alcohol use.  Practicing safe sex.  Taking low-dose aspirin every day starting at age 50. What happens during an annual well check? The services and screenings done by your health care provider during your annual well check will depend on your age, overall health, lifestyle risk factors, and family history of disease. Counseling  Your health care provider may ask you questions about your:  Alcohol use.  Tobacco use.  Drug use.  Emotional well-being.  Home and relationship well-being.  Sexual activity.  Eating habits.  Work and work environment. Screening  You may have the following tests or measurements:  Height, weight, and BMI.  Blood pressure.  Lipid and cholesterol levels. These may be checked every 5 years, or more frequently if you are over 50 years old.  Skin check.  Lung cancer screening. You may have this screening every year starting at age 55 if you have a 30-pack-year history of smoking and currently smoke or have quit within the past 15 years.  Fecal occult blood test (FOBT) of the stool. You may have this test every year starting at age 50.  Flexible sigmoidoscopy or colonoscopy. You may have a sigmoidoscopy every 5 years or a colonoscopy every 10 years starting at age 50.  Prostate cancer screening. Recommendations will vary depending on your family history and other risks.  Hepatitis C  blood test.  Hepatitis B blood test.  Sexually transmitted disease (STD) testing.  Diabetes screening. This is done by checking your blood sugar (glucose) after you have not eaten for a while (fasting). You may have this done every 1-3 years. Discuss your test results, treatment options, and if necessary, the need for more tests with your health care provider. Vaccines  Your health care provider may recommend certain vaccines, such as:  Influenza vaccine. This is recommended every year.  Tetanus, diphtheria, and acellular pertussis (Tdap, Td) vaccine. You may need a Td booster every 10 years.  Varicella vaccine. You may need this if you have not been vaccinated.  Zoster vaccine. You may need this after age 60.  Measles, mumps, and rubella (MMR) vaccine. You may need at least one dose of MMR if you were born in 1957 or later. You may also need a second dose.  Pneumococcal 13-valent conjugate (PCV13) vaccine. You may need this if you have certain conditions and have not been vaccinated.  Pneumococcal polysaccharide (PPSV23) vaccine. You may need one or two doses if you smoke cigarettes or if you have certain conditions.  Meningococcal vaccine. You may need this if you have certain conditions.  Hepatitis A vaccine. You may need this if you have certain conditions or if you travel or work in places where you may be exposed to hepatitis A.  Hepatitis B vaccine. You may need this if you have certain conditions or if you travel or work in places where you may be exposed to hepatitis B.  Haemophilus influenzae type b (Hib)   vaccine. You may need this if you have certain risk factors. Talk to your health care provider about which screenings and vaccines you need and how often you need them. This information is not intended to replace advice given to you by your health care provider. Make sure you discuss any questions you have with your health care provider. Smoking tobacco is very bad for  your health. You should stop smoking immediately.    Document Released: 08/02/2015 Document Revised: 03/25/2016 Document Reviewed: 05/07/2015 Elsevier Interactive Patient Education  2017 [employer].

## 2016-08-25 ENCOUNTER — Telehealth: Payer: Self-pay | Admitting: Internal Medicine

## 2016-08-25 NOTE — Telephone Encounter (Signed)
Pt returning your call concerning lab results °

## 2017-07-26 ENCOUNTER — Ambulatory Visit (INDEPENDENT_AMBULATORY_CARE_PROVIDER_SITE_OTHER): Payer: Self-pay | Admitting: Internal Medicine

## 2017-07-26 ENCOUNTER — Encounter: Payer: Self-pay | Admitting: Internal Medicine

## 2017-07-26 VITALS — BP 118/62 | HR 67 | Temp 98.1°F | Ht 70.5 in | Wt 205.6 lb

## 2017-07-26 DIAGNOSIS — M549 Dorsalgia, unspecified: Secondary | ICD-10-CM

## 2017-07-26 DIAGNOSIS — G8929 Other chronic pain: Secondary | ICD-10-CM

## 2017-07-26 DIAGNOSIS — F411 Generalized anxiety disorder: Secondary | ICD-10-CM

## 2017-07-26 DIAGNOSIS — F17200 Nicotine dependence, unspecified, uncomplicated: Secondary | ICD-10-CM

## 2017-07-26 MED ORDER — ALPRAZOLAM 0.5 MG PO TABS: 0.5000 mg | ORAL_TABLET | Freq: Two times a day (BID) | ORAL | 0 refills | Status: DC | PRN

## 2017-07-26 MED ORDER — METHYLPREDNISOLONE ACETATE 80 MG/ML IJ SUSP
80.0000 mg | Freq: Once | INTRAMUSCULAR | Status: AC
  Administered 2017-07-26: 80 mg via INTRAMUSCULAR

## 2017-07-26 NOTE — Patient Instructions (Signed)
It is important that you exercise regularly, at least 20 minutes 3 to 4 times per week.  If you develop chest pain or shortness of breath seek  medical attention.  Return in one year for follow-up   

## 2017-07-26 NOTE — Progress Notes (Signed)
Subjective:    Patient ID: Justin Norris, male    DOB: [birth year], 47 y.o.   MRN: [id number]  HPI 47 year old patient who his seen today for follow-up. He has not been seen in about 1 year and has done quite well.  Over this period of time he has married and has a young daughter.  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  He continues to take alprazolam on a as needed basis in general has done well.  He is requesting a cortisone injection which has been quite helpful for flares of low back pain in the past  Past Medical History:  Diagnosis Date  . ANXIETY 07/15/2007  . BACK PAIN, RIGHT 07/10/2010  . Headache(784.0) 07/15/2007  . NEPHROLITHIASIS, HX OF 07/15/2007     Social History   Socioeconomic History  . Marital status: Single    Spouse name: Not on file  . Number of children: 0  . Years of education: Not on file  . Highest education level: Not on file  Social Needs  . Financial resource strain: Not on file  . Food insecurity - worry: Not on file  . Food insecurity - inability: Not on file  . Transportation needs - medical: Not on file  . Transportation needs - non-medical: Not on file  Occupational History  . Occupation: Environmental health practitioner: [employer]  Tobacco Use  . Smoking status: Former Smoker    Years: 20.00    Types: Cigarettes    Last attempt to quit: 07/03/2014    Years since quitting: 3.0  . Smokeless tobacco: Never Used  Substance and Sexual Activity  . Alcohol use: No    Alcohol/week: 0.0 oz    Comment: rare  . Drug use: No  . Sexual activity: Not on file  Other Topics Concern  . Not on file  Social History Narrative  . Not on file    Past Surgical History:  Procedure Laterality Date  . HERNIA REPAIR     LIH    Family History  Problem Relation Age of Onset  . Heart disease Father     No Known Allergies  Current Outpatient  Medications on File Prior to Visit  Medication Sig Dispense Refill  . HYDROcodone-acetaminophen (NORCO/VICODIN) 5-325 MG tablet Take 1 tablet by mouth every 6 (six) hours as needed. 60 tablet 0  . podofilox (CONDYLOX) 0.5 % gel Apply topically 2 (two) times daily. 3.5 g 0  . tadalafil (CIALIS) 20 MG tablet Take 1 tablet (20 mg total) by mouth daily as needed for erectile dysfunction. 30 tablet 3   No current facility-administered medications on file prior to visit.     BP 118/62 (BP Location: Left Arm, Patient Position: Sitting, Cuff Size: Normal)   Pulse 67   Temp 98.1 F (36.7 C) (Oral)   Ht 5' 10.5" (1.791 m)   Wt 205 lb 9.6 oz (93.3 kg)   SpO2 98%   BMI 29.08 kg/m      Review of Systems  Constitutional: Negative for appetite change, chills, fatigue and fever.  HENT: Negative for congestion, dental problem, ear pain, hearing loss, sore throat, tinnitus, trouble swallowing and voice change.   Eyes: Negative for pain, discharge and visual disturbance.  Respiratory: Negative for cough, chest tightness, wheezing and stridor.   Cardiovascular: Negative for chest pain, palpitations and leg swelling.  Gastrointestinal:  Negative for abdominal distention, abdominal pain, blood in stool, constipation, diarrhea, nausea and vomiting.  Genitourinary: Negative for difficulty urinating, discharge, flank pain, genital sores, hematuria and urgency.  Musculoskeletal: Positive for back pain. Negative for arthralgias, gait problem, joint swelling, myalgias and neck stiffness.  Skin: Negative for rash.  Neurological: Negative for dizziness, syncope, speech difficulty, weakness, numbness and headaches.  Hematological: Negative for adenopathy. Does not bruise/bleed easily.  Psychiatric/Behavioral: Negative for behavioral problems and dysphoric mood. The patient is nervous/anxious.        Objective:   Physical Exam  Constitutional: He appears well-developed and well-nourished. No distress.    Musculoskeletal:  Suggestion of some tight tense lumbar musculature.  Negative straight leg test.  Normal gait  Psychiatric: He has a normal mood and affect. His behavior is normal. Judgment and thought content normal.          Assessment & Plan:   Flare low back pain.  Will treat with Depo-Medrol which has been helpful in the past continue anti-inflammatory medications and analgesics as needed Anxiety disorder.  Alprazolam refilled  Schedule CPX  Rogelia BogaKWIATKOWSKI,PETER FRANK

## 2018-01-12 ENCOUNTER — Ambulatory Visit (INDEPENDENT_AMBULATORY_CARE_PROVIDER_SITE_OTHER): Payer: Self-pay | Admitting: Internal Medicine

## 2018-01-12 ENCOUNTER — Encounter: Payer: Self-pay | Admitting: Internal Medicine

## 2018-01-12 VITALS — BP 130/82 | HR 90 | Temp 98.9°F | Wt 195.0 lb

## 2018-01-12 DIAGNOSIS — F411 Generalized anxiety disorder: Secondary | ICD-10-CM

## 2018-01-12 DIAGNOSIS — G8929 Other chronic pain: Secondary | ICD-10-CM

## 2018-01-12 DIAGNOSIS — M549 Dorsalgia, unspecified: Secondary | ICD-10-CM

## 2018-01-12 MED ORDER — METHYLPREDNISOLONE ACETATE 80 MG/ML IJ SUSP
80.0000 mg | Freq: Once | INTRAMUSCULAR | Status: AC
  Administered 2018-01-12: 80 mg via INTRAMUSCULAR

## 2018-01-12 MED ORDER — ALPRAZOLAM 0.5 MG PO TABS: 0.5000 mg | ORAL_TABLET | Freq: Two times a day (BID) | ORAL | 0 refills | Status: DC | PRN

## 2018-01-12 NOTE — Patient Instructions (Addendum)
Most patients with low back pain will improve with time over the next two to 6 weeks.  Keep active but avoid any activities that cause pain.  Apply moist heat to the low back area several times daily.   Back Exercises The following exercises strengthen the muscles that help to support the back. They also help to keep the lower back flexible. Doing these exercises can help to prevent back pain or lessen existing pain. If you have back pain or discomfort, try doing these exercises 2-3 times each day or as told by your health care provider. When the pain goes away, do them once each day, but increase the number of times that you repeat the steps for each exercise (do more repetitions). If you do not have back pain or discomfort, do these exercises once each day or as told by your health care provider. Exercises Single Knee to Chest  Repeat these steps 3-5 times for each leg: 1. Lie on your back on a firm bed or the floor with your legs extended. 2. Bring one knee to your chest. Your other leg should stay extended and in contact with the floor. 3. Hold your knee in place by grabbing your knee or thigh. 4. Pull on your knee until you feel a gentle stretch in your lower back. 5. Hold the stretch for 10-30 seconds. 6. Slowly release and straighten your leg.  Pelvic Tilt  Repeat these steps 5-10 times: 1. Lie on your back on a firm bed or the floor with your legs extended. 2. Bend your knees so they are pointing toward the ceiling and your feet are flat on the floor. 3. Tighten your lower abdominal muscles to press your lower back against the floor. This motion will tilt your pelvis so your tailbone points up toward the ceiling instead of pointing to your feet or the floor. 4. With gentle tension and even breathing, hold this position for 5-10 seconds.  Cat-Cow  Repeat these steps until your lower back becomes more flexible: 1. Get into a hands-and-knees position on a firm surface. Keep your  hands under your shoulders, and keep your knees under your hips. You may place padding under your knees for comfort. 2. Let your head hang down, and point your tailbone toward the floor so your lower back becomes rounded like the back of a cat. 3. Hold this position for 5 seconds. 4. Slowly lift your head and point your tailbone up toward the ceiling so your back forms a sagging arch like the back of a cow. 5. Hold this position for 5 seconds.  Press-Ups  Repeat these steps 5-10 times: 1. Lie on your abdomen (face-down) on the floor. 2. Place your palms near your head, about shoulder-width apart. 3. While you keep your back as relaxed as possible and keep your hips on the floor, slowly straighten your arms to raise the top half of your body and lift your shoulders. Do not use your back muscles to raise your upper torso. You may adjust the placement of your hands to make yourself more comfortable. 4. Hold this position for 5 seconds while you keep your back relaxed. 5. Slowly return to lying flat on the floor.  Bridges  Repeat these steps 10 times: 1. Lie on your back on a firm surface. 2. Bend your knees so they are pointing toward the ceiling and your feet are flat on the floor. 3. Tighten your buttocks muscles and lift your buttocks off of the floor  until your waist is at almost the same height as your knees. You should feel the muscles working in your buttocks and the back of your thighs. If you do not feel these muscles, slide your feet 1-2 inches farther away from your buttocks. 4. Hold this position for 3-5 seconds. 5. Slowly lower your hips to the starting position, and allow your buttocks muscles to relax completely.  If this exercise is too easy, try doing it with your arms crossed over your chest. Abdominal Crunches  Repeat these steps 5-10 times: 1. Lie on your back on a firm bed or the floor with your legs extended. 2. Bend your knees so they are pointing toward the ceiling  and your feet are flat on the floor. 3. Cross your arms over your chest. 4. Tip your chin slightly toward your chest without bending your neck. 5. Tighten your abdominal muscles and slowly raise your trunk (torso) high enough to lift your shoulder blades a tiny bit off of the floor. Avoid raising your torso higher than that, because it can put too much stress on your low back and it does not help to strengthen your abdominal muscles. 6. Slowly return to your starting position.  Back Lifts Repeat these steps 5-10 times: 1. Lie on your abdomen (face-down) with your arms at your sides, and rest your forehead on the floor. 2. Tighten the muscles in your legs and your buttocks. 3. Slowly lift your chest off of the floor while you keep your hips pressed to the floor. Keep the back of your head in line with the curve in your back. Your eyes should be looking at the floor. 4. Hold this position for 3-5 seconds. 5. Slowly return to your starting position.  Contact a health care provider if:  Your back pain or discomfort gets much worse when you do an exercise.  Your back pain or discomfort does not lessen within 2 hours after you exercise. If you have any of these problems, stop doing these exercises right away. Do not do them again unless your health care provider says that you can. Get help right away if:  You develop sudden, severe back pain. If this happens, stop doing the exercises right away. Do not do them again unless your health care provider says that you can. This information is not intended to replace advice given to you by your health care provider. Make sure you discuss any questions you have with your health care provider. Document Released: 08/13/2004 Document Revised: 11/13/2015 Document Reviewed: 08/30/2014 Elsevier Interactive Patient Education  2017 [employer]

## 2018-01-12 NOTE — Addendum Note (Signed)
Addended by: Waymon AmatoJOHNSON, KENDRA R on: 01/12/2018 03:18 PM   Modules accepted: Orders

## 2018-01-12 NOTE — Progress Notes (Signed)
Subjective:    Patient ID: Justin Norris, male    DOB: [birth year], 47 y.o.   MRN: [id number]  HPI  48 year old patient who has a history of intermittent low back pain.  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 He is requesting a refill on alprazolam to assist with sleep.  Past Medical History:  Diagnosis Date  . ANXIETY 07/15/2007  . BACK PAIN, RIGHT 07/10/2010  . Headache(784.0) 07/15/2007  . NEPHROLITHIASIS, HX OF 07/15/2007     Social History   Socioeconomic History  . Marital status: Single    Spouse name: Not on file  . Number of children: 0  . Years of education: Not on file  . Highest education level: Not on file  Occupational History  . Occupation: Environmental health practitioner: [employer]  Social Needs  . Financial resource strain: Not on file  . Food insecurity:    Worry: Not on file    Inability: Not on file  . Transportation needs:    Medical: Not on file    Non-medical: Not on file  Tobacco Use  . Smoking status: Former Smoker    Years: 20.00    Types: Cigarettes    Last attempt to quit: 07/03/2014    Years since quitting: 3.5  . Smokeless tobacco: Never Used  Substance and Sexual Activity  . Alcohol use: No    Alcohol/week: 0.0 oz    Comment: rare  . Drug use: No  . Sexual activity: Not on file  Lifestyle  . Physical activity:    Days per week: Not on file    Minutes per session: Not on file  . Stress: Not on file  Relationships  . Social connections:    Talks on phone: Not on file    Gets together: Not on file    Attends religious service: Not on file    Active member of club or organization: Not on file    Attends meetings of clubs or organizations: Not on file    Relationship status: Not on file  . Intimate partner violence:    Fear of current or ex partner:  Not on file    Emotionally abused: Not on file    Physically abused: Not on file    Forced sexual activity: Not on file  Other Topics Concern  . Not on file  Social History Narrative  . Not on file    Past Surgical History:  Procedure Laterality Date  . HERNIA REPAIR     LIH    Family History  Problem Relation Age of Onset  . Heart disease Father     No Known Allergies  Current Outpatient Medications on File Prior to Visit  Medication Sig Dispense Refill  . ALPRAZolam (XANAX) 0.5 MG tablet Take 1 tablet (0.5 mg total) by mouth 2 (two) times daily as needed. 60 tablet 0  . HYDROcodone-acetaminophen (NORCO/VICODIN) 5-325 MG tablet Take 1 tablet by mouth every 6 (six) hours as needed. 60 tablet 0  . podofilox (CONDYLOX) 0.5 % gel Apply topically 2 (two) times daily. 3.5 g 0  . tadalafil (CIALIS) 20 MG tablet Take 1 tablet (20 mg total) by mouth daily as needed for erectile dysfunction. 30 tablet 3   No current facility-administered medications on file prior to visit.  BP 130/82 (BP Location: Right Arm, Patient Position: Sitting, Cuff Size: Large)   Pulse 90   Temp 98.9 F (37.2 C) (Oral)   Wt 195 lb (88.5 kg)   SpO2 96%   BMI 27.58 kg/m     Review of Systems  Constitutional: Negative for appetite change, chills, fatigue and fever.  HENT: Negative for congestion, dental problem, ear pain, hearing loss, sore throat, tinnitus, trouble swallowing and voice change.   Eyes: Negative for pain, discharge and visual disturbance.  Respiratory: Negative for cough, chest tightness, wheezing and stridor.   Cardiovascular: Negative for chest pain, palpitations and leg swelling.  Gastrointestinal: Negative for abdominal distention, abdominal pain, blood in stool, constipation, diarrhea, nausea and vomiting.  Genitourinary: Negative for difficulty urinating, discharge, flank pain, genital sores, hematuria and urgency.  Musculoskeletal: Positive for back pain. Negative for  arthralgias, gait problem, joint swelling, myalgias and neck stiffness.  Skin: Negative for rash.  Neurological: Negative for dizziness, syncope, speech difficulty, weakness, numbness and headaches.  Hematological: Negative for adenopathy. Does not bruise/bleed easily.  Psychiatric/Behavioral: Positive for sleep disturbance. Negative for behavioral problems and dysphoric mood. The patient is nervous/anxious.        Objective:   Physical Exam  Constitutional: He is oriented to person, place, and time. He appears well-developed.  HENT:  Head: Normocephalic.  Right Ear: External ear normal.  Left Ear: External ear normal.  Eyes: Conjunctivae and EOM are normal.  Neck: Normal range of motion.  Cardiovascular: Normal rate and normal heart sounds.  Pulmonary/Chest: Breath sounds normal.  Abdominal: Bowel sounds are normal.  Musculoskeletal: Normal range of motion. He exhibits no edema or tenderness.   Mild subjective tenderness and spasm involving the right lumbar region  Neurological: He is alert and oriented to person, place, and time.  Psychiatric: He has a normal mood and affect. His behavior is normal.          Assessment & Plan:   Lumbar strain.  Will treat with Depo-Medrol 80.  This has been used sparingly in the past with nice benefit. Anxiety disorder/insomnia alprazolam refilled  CPX 6 months  Gordy SaversPeter F Phyllistine Domingos

## 2021-12-22 ENCOUNTER — Ambulatory Visit
Admission: EM | Admit: 2021-12-22 | Discharge: 2021-12-22 | Disposition: A | Discharge status: Home / Self Care | Payer: Managed Care, Other (non HMO) | Attending: Physician Assistant | Admitting: Physician Assistant

## 2021-12-22 DIAGNOSIS — J020 Streptococcal pharyngitis: Secondary | ICD-10-CM

## 2021-12-22 DIAGNOSIS — R52 Pain, unspecified: Secondary | ICD-10-CM

## 2021-12-22 DIAGNOSIS — L237 Allergic contact dermatitis due to plants, except food: Secondary | ICD-10-CM | POA: Diagnosis not present

## 2021-12-22 MED ORDER — TRIAMCINOLONE ACETONIDE 0.1 % EX CREA: 1.0000 "application " | TOPICAL_CREAM | Freq: Two times a day (BID) | CUTANEOUS | 0 refills | Status: AC

## 2021-12-22 MED ORDER — PENICILLIN G BENZATHINE 1200000 UNIT/2ML IM SUSY
1.2000 10*6.[IU] | PREFILLED_SYRINGE | Freq: Once | INTRAMUSCULAR | Status: AC
  Administered 2021-12-22: 1.2 10*6.[IU] via INTRAMUSCULAR

## 2021-12-22 MED ORDER — PREDNISONE 20 MG PO TABS: 40.0000 mg | ORAL_TABLET | Freq: Every day | ORAL | 0 refills | Status: AC

## 2021-12-22 MED ORDER — LIDOCAINE VISCOUS HCL 2 % MT SOLN: 15.0000 mL | OROMUCOSAL | 0 refills | Status: DC | PRN

## 2021-12-22 NOTE — Discharge Instructions (Signed)
-  We have given you a penicillin injection for strep throat. - Increase fluid intake and rest. - I have sent viscous lidocaine to help numb your throat but it should be feeling better in the next 2 days or so. - Tylenol for pain relief and Chloraseptic spray as well as throat lozenges and cool liquids. - I sent prednisone to help with the poison ivy and with the throat swelling.  I also sent a topical corticosteroid to apply to the rash 2.  Try not to scratch.

## 2021-12-22 NOTE — ED Triage Notes (Addendum)
Pt c/o possible strep throat x2days.  Pt is having difficulty swallowing, cough, congestion, headache, and gas.  Pt c/o poison Ivy along the right flank and chest.

## 2021-12-22 NOTE — ED Provider Notes (Signed)
[facility]    CSN: [id number] Arrival date & time: 12/22/21  1916      History   Chief Complaint No chief complaint on file.   HPI Justin Norris is a 51 y.o. male.   HPI  Past Medical History:  Diagnosis Date   ANXIETY 07/15/2007   BACK PAIN, RIGHT 07/10/2010   Headache(784.0) 07/15/2007   NEPHROLITHIASIS, HX OF 07/15/2007    Patient Active Problem List   Diagnosis Date Noted   Tobacco use disorder 03/19/2014   Abdominal pain 05/13/2012   Bloating 05/13/2012   Umbilical hernia 11/23/2011   Constipation 11/23/2011   Backache 07/10/2010   Anxiety state 07/15/2007   HEADACHE 07/15/2007   NEPHROLITHIASIS, HX OF 07/15/2007    Past Surgical History:  Procedure Laterality Date   HERNIA REPAIR     LIH       Home Medications    Prior to Admission medications   Medication Sig Start Date End Date Taking? Authorizing Provider  ALPRAZolam Prudy Feeler) 0.5 MG tablet Take 1 tablet (0.5 mg total) by mouth 2 (two) times daily as needed. 01/12/18   Gordy Savers, MD  podofilox (CONDYLOX) 0.5 % gel Apply topically 2 (two) times daily. 03/02/16   Gordy Savers, MD  tadalafil (CIALIS) 20 MG tablet Take 1 tablet (20 mg total) by mouth daily as needed for erectile dysfunction. 06/04/15   Nelwyn Salisbury, MD    Family History Family History  Problem Relation Age of Onset   Heart disease Father     Social History Social History   Tobacco Use   Smoking status: Former    Years: 20.00    Types: Cigarettes    Quit date: 07/03/2014    Years since quitting: 7.4   Smokeless tobacco: Never  Substance Use Topics   Alcohol use: No    Alcohol/week: 0.0 standard drinks    Comment: rare   Drug use: No     Allergies   Patient has no known allergies.   Review of Systems Review of Systems  Constitutional:  Positive for fever. Negative for fatigue.  HENT:  Positive for sore throat. Negative for congestion, rhinorrhea, sinus pressure and sinus pain.    Respiratory:  Negative for cough and shortness of breath.   Gastrointestinal:  Negative for abdominal pain, diarrhea, nausea and vomiting.  Musculoskeletal:  Negative for myalgias.  Neurological:  Negative for weakness, light-headedness and headaches.  Hematological:  Negative for adenopathy.    Physical Exam Triage Vital Signs ED Triage Vitals  Enc Vitals Group     BP      Pulse      Resp      Temp      Temp src      SpO2      Weight      Height      Head Circumference      Peak Flow      Pain Score      Pain Loc      Pain Edu?      Excl. in GC?    No data found.  Updated Vital Signs There were no vitals taken for this visit.    Physical Exam Vitals and nursing note reviewed.  Constitutional:      General: He is not in acute distress.    Appearance: Normal appearance. He is well-developed. He is not ill-appearing.  HENT:     Head: Normocephalic and atraumatic.     Nose: Nose  normal.     Mouth/Throat:     Mouth: Mucous membranes are moist.     Pharynx: Oropharynx is clear. Posterior oropharyngeal erythema present.  Eyes:     General: No scleral icterus.    Conjunctiva/sclera: Conjunctivae normal.  Cardiovascular:     Rate and Rhythm: Normal rate and regular rhythm.     Heart sounds: Normal heart sounds.  Pulmonary:     Effort: Pulmonary effort is normal. No respiratory distress.     Breath sounds: Normal breath sounds.  Musculoskeletal:     Cervical back: Neck supple.  Skin:    General: Skin is warm and dry.     Capillary Refill: Capillary refill takes less than 2 seconds.  Neurological:     General: No focal deficit present.     Mental Status: He is alert. Mental status is at baseline.     Motor: No weakness.     Gait: Gait normal.  Psychiatric:        Mood and Affect: Mood normal.        Behavior: Behavior normal.     UC Treatments / Results  Labs (all labs ordered are listed, but only abnormal results are displayed) Labs Reviewed - No data  to display  EKG   Radiology No results found.  Procedures Procedures (including critical care time)  Medications Ordered in UC Medications - No data to display  Initial Impression / Assessment and Plan / UC Course  I have reviewed the triage vital signs and the nursing notes.  Pertinent labs & imaging results that were available during my care of the patient were reviewed by me and considered in my medical decision making (see chart for details).     *** Final Clinical Impressions(s) / UC Diagnoses   Final diagnoses:  None   Discharge Instructions   None    ED Prescriptions   None    PDMP not reviewed this encounter.

## 2022-03-13 ENCOUNTER — Ambulatory Visit (INDEPENDENT_AMBULATORY_CARE_PROVIDER_SITE_OTHER): Payer: Managed Care, Other (non HMO)

## 2022-03-13 ENCOUNTER — Ambulatory Visit: Payer: Managed Care, Other (non HMO) | Admitting: Family Medicine

## 2022-03-13 ENCOUNTER — Encounter: Payer: Self-pay | Admitting: Family Medicine

## 2022-03-13 VITALS — BP 133/86 | HR 65 | Ht 71.26 in | Wt 215.7 lb

## 2022-03-13 DIAGNOSIS — M25572 Pain in left ankle and joints of left foot: Secondary | ICD-10-CM

## 2022-03-13 DIAGNOSIS — M79672 Pain in left foot: Secondary | ICD-10-CM | POA: Diagnosis not present

## 2022-03-13 DIAGNOSIS — Z0000 Encounter for general adult medical examination without abnormal findings: Secondary | ICD-10-CM | POA: Diagnosis not present

## 2022-03-13 DIAGNOSIS — R6882 Decreased libido: Secondary | ICD-10-CM | POA: Diagnosis not present

## 2022-03-13 NOTE — Progress Notes (Signed)
Justin Norris - 51 y.o. male MRN [id number]  Date of birth: [birth year]  Subjective Chief Complaint  Patient presents with   Establish Care    HPI Justin Norris is a 51 year old male here today for initial visit to establish care.  He feels like he has been in fairly good health.  He denies any chronic medical problems.  Has not had a primary care doctor in the past couple years.  He does have some concerns about mild change in libido and decreased testosterone levels.  We would like to have the check today as well as other routine labs.  He is having some issues with his left ankle.  He reports that about 2 years ago spontaneous swelling of the left ankle.  He does not recall any injury to this ankle.  He questions if he may have had gout.  He has had continued pain off and on since that time.  He does have a bunion on this foot.  ROS:  A comprehensive ROS was completed and negative except as noted per HPI  Allergies  Allergen Reactions   Coconut Fatty Acids Anaphylaxis    Past Medical History:  Diagnosis Date   ANXIETY 07/15/2007   BACK PAIN, RIGHT 07/10/2010   Headache(784.0) 07/15/2007   NEPHROLITHIASIS, HX OF 07/15/2007    Past Surgical History:  Procedure Laterality Date   HERNIA REPAIR     LIH    Social History   Socioeconomic History   Marital status: Married    Spouse name: Not on file   Number of children: 9   Years of education: Not on file   Highest education level: Not on file  Occupational History   Occupation: Environmental health practitioner: [employer]  Tobacco Use   Smoking status: Former    Years: 20.00    Types: Cigarettes    Quit date: 07/03/2014    Years since quitting: 7.7    Passive exposure: Never   Smokeless tobacco: Never  Vaping Use   Vaping Use: Never used  Substance and Sexual Activity   Alcohol use: No    Alcohol/week: 0.0 standard drinks of alcohol    Comment: rare   Drug use: No   Sexual activity: Yes    Partners: Female   Other Topics Concern   Not on file  Social History Narrative   Not on file   Social Determinants of Health   Financial Resource Strain: Not on file  Food Insecurity: Not on file  Transportation Needs: Not on file  Physical Activity: Not on file  Stress: Not on file  Social Connections: Not on file    Family History  Problem Relation Age of Onset   Heart disease Father    Skin cancer Father     Health Maintenance  Topic Date Due   Zoster Vaccines- Shingrix (1 of 2) 07/20/2022 (Originally 06/03/2019)   INFLUENZA VACCINE  10/18/2022 (Originally 02/17/2022)   COVID-19 Vaccine (5 - Pfizer series) 10/18/2022 (Originally 01/03/2021)   COLONOSCOPY (Pts 45-6yrs Insurance coverage will need to be confirmed)  03/14/2023 (Originally 06/02/2014)   TETANUS/TDAP  03/14/2023 (Originally 06/02/1988)   Hepatitis C Screening  03/14/2023 (Originally 06/03/1987)   HIV Screening  03/14/2023 (Originally 06/02/1984)   HPV VACCINES  Aged Out     ----------------------------------------------------------------------------------------------------------------------------------------------------------------------------------------------------------------- Physical Exam BP 133/86 (BP Location: Left Arm, Patient Position: Sitting, Cuff Size: Normal)   Pulse 65   Ht 5' 11.26" (1.81 m)   Wt 215 lb 11.2 oz (97.8 kg)  SpO2 97%   BMI 29.86 kg/m   Physical Exam Constitutional:      Appearance: Normal appearance.  Eyes:     General: No scleral icterus. Cardiovascular:     Rate and Rhythm: Normal rate and regular rhythm.  Pulmonary:     Effort: Pulmonary effort is normal.     Breath sounds: Normal breath sounds.  Musculoskeletal:     Cervical back: Neck supple.  Neurological:     Mental Status: He is alert.  Psychiatric:        Mood and Affect: Mood normal.        Behavior: Behavior normal.      ------------------------------------------------------------------------------------------------------------------------------------------------------------------------------------------------------------------- Assessment and Plan  Acute left ankle pain Recurrent issues with left ankle pain.  X-rays of the ankle ordered.  Checking uric acid levels.  Decreased libido Checking testosterone levels.  If low will repeat early morning levels.   No orders of the defined types were placed in this encounter.   No follow-ups on file.    This visit occurred during the SARS-CoV-2 public health emergency.  Safety protocols were in place, including screening questions prior to the visit, additional usage of staff PPE, and extensive cleaning of exam room while observing appropriate contact time as indicated for disinfecting solutions.

## 2022-03-14 LAB — COMPLETE METABOLIC PANEL WITH GFR
AG Ratio: 2 (calc) (ref 1.0–2.5)
ALT: 25 U/L (ref 9–46)
AST: 18 U/L (ref 10–35)
Albumin: 4.7 g/dL (ref 3.6–5.1)
Alkaline phosphatase (APISO): 78 U/L (ref 35–144)
BUN: 15 mg/dL (ref 7–25)
CO2: 28 mmol/L (ref 20–32)
Calcium: 9.8 mg/dL (ref 8.6–10.3)
Chloride: 105 mmol/L (ref 98–110)
Creat: 1.05 mg/dL (ref 0.70–1.30)
Globulin: 2.4 g/dL (calc) (ref 1.9–3.7)
Glucose, Bld: 83 mg/dL (ref 65–99)
Potassium: 4.7 mmol/L (ref 3.5–5.3)
Sodium: 144 mmol/L (ref 135–146)
Total Bilirubin: 0.7 mg/dL (ref 0.2–1.2)
Total Protein: 7.1 g/dL (ref 6.1–8.1)
eGFR: 85 mL/min/{1.73_m2} (ref >=60)

## 2022-03-14 LAB — TESTOSTERONE: Testosterone: 245 ng/dL — ABNORMAL LOW (ref 250–827)

## 2022-03-14 LAB — CBC WITH DIFFERENTIAL/PLATELET
Absolute Monocytes: 354 cells/uL (ref 200–950)
Basophils Absolute: 62 cells/uL (ref 0–200)
Basophils Relative: 1.2 %
Eosinophils Absolute: 172 cells/uL (ref 15–500)
Eosinophils Relative: 3.3 %
HCT: 44.4 % (ref 38.5–50.0)
Hemoglobin: 15.3 g/dL (ref 13.2–17.1)
Lymphs Abs: 1524 cells/uL (ref 850–3900)
MCH: 30.5 pg (ref 27.0–33.0)
MCHC: 34.5 g/dL (ref 32.0–36.0)
MCV: 88.4 fL (ref 80.0–100.0)
MPV: 11.1 fL (ref 7.5–12.5)
Monocytes Relative: 6.8 %
Neutro Abs: 3089 cells/uL (ref 1500–7800)
Neutrophils Relative %: 59.4 %
Platelets: 242 10*3/uL (ref 140–400)
RBC: 5.02 10*6/uL (ref 4.20–5.80)
RDW: 13.4 % (ref 11.0–15.0)
Total Lymphocyte: 29.3 %
WBC: 5.2 10*3/uL (ref 3.8–10.8)

## 2022-03-14 LAB — URIC ACID: Uric Acid, Serum: 6 mg/dL (ref 4.0–8.0)

## 2022-03-14 LAB — TSH: TSH: 1.86 mIU/L (ref 0.40–4.50)

## 2022-03-15 DIAGNOSIS — M25572 Pain in left ankle and joints of left foot: Secondary | ICD-10-CM | POA: Insufficient documentation

## 2022-03-15 DIAGNOSIS — R6882 Decreased libido: Secondary | ICD-10-CM | POA: Insufficient documentation

## 2022-03-15 NOTE — Assessment & Plan Note (Signed)
Recurrent issues with left ankle pain.  X-rays of the ankle ordered.  Checking uric acid levels.

## 2022-03-15 NOTE — Assessment & Plan Note (Signed)
Checking testosterone levels.  If low will repeat early morning levels.

## 2022-03-18 ENCOUNTER — Other Ambulatory Visit: Payer: Self-pay | Admitting: Family Medicine

## 2022-03-18 DIAGNOSIS — R7989 Other specified abnormal findings of blood chemistry: Secondary | ICD-10-CM

## 2022-03-30 ENCOUNTER — Telehealth: Payer: Self-pay | Admitting: Family Medicine

## 2022-03-30 NOTE — Telephone Encounter (Signed)
Yes, as long as it is prior to 10am.

## 2022-03-30 NOTE — Telephone Encounter (Signed)
Pt is coming in for labs tomorrow and Dr's appt. next week.

## 2022-03-30 NOTE — Telephone Encounter (Signed)
Pt called. The earliest he can come to get is testosterone bloodwork at 9:30, is this okay? He has to drop his kids off to school.

## 2022-04-01 LAB — TESTOSTERONE: Testosterone: 262 ng/dL (ref 250–827)

## 2022-04-16 ENCOUNTER — Encounter: Payer: Self-pay | Admitting: Family Medicine

## 2022-04-16 ENCOUNTER — Ambulatory Visit: Payer: Managed Care, Other (non HMO) | Admitting: Family Medicine

## 2022-04-16 DIAGNOSIS — A630 Anogenital (venereal) warts: Secondary | ICD-10-CM

## 2022-04-16 DIAGNOSIS — E291 Testicular hypofunction: Secondary | ICD-10-CM | POA: Diagnosis not present

## 2022-04-16 MED ORDER — TADALAFIL 20 MG PO TABS
20.0000 mg | ORAL_TABLET | Freq: Every day | ORAL | 3 refills | Status: DC | PRN
Start: 2022-04-16 — End: 2023-05-19

## 2022-04-16 MED ORDER — IMIQUIMOD 5 % EX CREA: TOPICAL_CREAM | CUTANEOUS | 0 refills | Status: DC

## 2022-04-16 MED ORDER — ANDRODERM 4 MG/24HR TD PT24: 1.0000 | MEDICATED_PATCH | Freq: Every day | TRANSDERMAL | 3 refills | Status: DC

## 2022-04-16 NOTE — Progress Notes (Addendum)
Justin Norris - 51 y.o. male MRN [id number]  Date of birth: 08/05/1970  Subjective Chief Complaint  Patient presents with   Results   Foot Pain    HPI Justin Norris is a 51 y.o. male here today for follow up of recent labs.  Noted to have low testosterone. Has had symptoms of decreased libido, erectile difficulty and fatigue.  He is interested in pursuing treatment of this.  He would like a topical treatment and would prefer the patch.  He has never had TRT in the past.  No history of heart disease, stroke, liver disease.    He also has some genital warts and would like to have cream for treatment of this.  Denies any pain associated with these.  ROS:  A comprehensive ROS was completed and negative except as noted per HPI  Allergies  Allergen Reactions   Coconut Fatty Acids Anaphylaxis    Past Medical History:  Diagnosis Date   ANXIETY 07/15/2007   BACK PAIN, RIGHT 07/10/2010   Headache(784.0) 07/15/2007   NEPHROLITHIASIS, HX OF 07/15/2007    Past Surgical History:  Procedure Laterality Date   HERNIA REPAIR     LIH    Social History   Socioeconomic History   Marital status: Married    Spouse name: Not on file   Number of children: 9   Years of education: Not on file   Highest education level: Not on file  Occupational History   Occupation: Engineer, technical sales: [employer]  Tobacco Use   Smoking status: Former    Years: 20.00    Types: Cigarettes    Quit date: 07/03/2014    Years since quitting: 7.7    Passive exposure: Never   Smokeless tobacco: Never  Vaping Use   Vaping Use: Never used  Substance and Sexual Activity   Alcohol use: No    Alcohol/week: 0.0 standard drinks of alcohol    Comment: rare   Drug use: No   Sexual activity: Yes    Partners: Female  Other Topics Concern   Not on file  Social History Narrative   Not on file   Social Determinants of Health   Financial Resource Strain: Not on file  Food Insecurity: Not on file   Transportation Needs: Not on file  Physical Activity: Not on file  Stress: Not on file  Social Connections: Not on file    Family History  Problem Relation Age of Onset   Heart disease Father    Skin cancer Father     Health Maintenance  Topic Date Due   Zoster Vaccines- Shingrix (1 of 2) 07/20/2022 (Originally 06/03/2019)   INFLUENZA VACCINE  10/18/2022 (Originally 02/17/2022)   COLONOSCOPY (Pts 45-10yrs Insurance coverage will need to be confirmed)  03/14/2023 (Originally 06/02/2014)   TETANUS/TDAP  03/14/2023 (Originally 06/02/1988)   Hepatitis C Screening  03/14/2023 (Originally 06/03/1987)   HIV Screening  03/14/2023 (Originally 06/02/1984)   COVID-19 Vaccine (6 - Pfizer series) 06/06/2022   HPV VACCINES  Aged Out     ----------------------------------------------------------------------------------------------------------------------------------------------------------------------------------------------------------------- Physical Exam BP 132/85 (BP Location: Left Arm, Patient Position: Sitting, Cuff Size: Normal)   Pulse 75   Ht 5' 11.26" (1.81 m)   Wt 208 lb (94.3 kg)   SpO2 98%   BMI 28.80 kg/m   Physical Exam Constitutional:      Appearance: Normal appearance.  Eyes:     General: No scleral icterus. Cardiovascular:     Rate and Justin: Normal rate  and regular Justin.  Pulmonary:     Effort: Pulmonary effort is normal.     Breath sounds: Normal breath sounds.  Musculoskeletal:     Cervical back: Neck supple.  Neurological:     Mental Status: He is alert.  Psychiatric:        Mood and Affect: Mood normal.        Behavior: Behavior normal.     ------------------------------------------------------------------------------------------------------------------------------------------------------------------------------------------------------------------- Assessment and Plan  Hypogonadism in male Discussed management of low testosterone.  He is  symptomatic with this and I think he would get benefit from treating this.  Reviewed pros and cons of testosterone use including potential for testicle atrophy.  Starting androderm 4mg /day.  F/u in 3 months and recheck.  He may continue tadalafil as needed for ED symptoms at this time.   Genital warts  Adding Aldara cream.   Meds ordered this encounter  Medications   testosterone (ANDRODERM) 4 MG/24HR PT24 patch    Sig: Place 1 patch onto the skin daily.    Dispense:  30 patch    Refill:  3   tadalafil (CIALIS) 20 MG tablet    Sig: Take 1 tablet (20 mg total) by mouth daily as needed for erectile dysfunction.    Dispense:  30 tablet    Refill:  3   imiquimod (ALDARA) 5 % cream    Sig: Apply topically 3 (three) times a week.    Dispense:  12 each    Refill:  0    Return in about 3 months (around 07/16/2022) for Recheck labs.    This visit occurred during the SARS-CoV-2 public health emergency.  Safety protocols were in place, including screening questions prior to the visit, additional usage of staff PPE, and extensive cleaning of exam room while observing appropriate contact time as indicated for disinfecting solutions.

## 2022-04-16 NOTE — Assessment & Plan Note (Signed)
Discussed management of low testosterone.  He is symptomatic with this and I think he would get benefit from treating this.  Reviewed pros and cons of testosterone use including potential for testicle atrophy.  Starting androderm 4mg /day.  F/u in 3 months and recheck.  He may continue tadalafil as needed for ED symptoms at this time.

## 2022-04-16 NOTE — Assessment & Plan Note (Signed)
  Adding Aldara cream.

## 2022-04-16 NOTE — Addendum Note (Signed)
Addended by: Perlie Mayo on: 04/16/2022 09:23 PM   Modules accepted: Orders

## 2022-04-28 ENCOUNTER — Telehealth: Payer: Self-pay

## 2022-04-28 NOTE — Telephone Encounter (Signed)
Initiated Prior authorization [id number] 4MG /24HR 24 hr patches Via: Covermymeds Case/Key:[id number]  Status: approved  as of 04/28/22 Reason:Coverage Start Date:04/28/2022;Coverage End Date:04/28/2023; Notified Pt via: Mychart, Called left vm in reagrds  to approval

## 2022-05-19 ENCOUNTER — Telehealth: Payer: Self-pay | Admitting: Family Medicine

## 2022-05-19 NOTE — Telephone Encounter (Signed)
LVM requesting callback with information.

## 2022-05-19 NOTE — Telephone Encounter (Signed)
Does he feel comfortable doing injections at home or would he prefer to have these done here?

## 2022-05-19 NOTE — Telephone Encounter (Signed)
Patient called and stated Androderm is no longer an available medication and he wishes to do injections or be prescribed an alternative medication. Pharmacy has not been responsive about it. Please advise. Buel Ream

## 2022-05-20 ENCOUNTER — Telehealth: Payer: Self-pay

## 2022-05-20 MED ORDER — "SYRINGE/NEEDLE (DISP) 23G X 1"" 3 ML MISC": 1 refills | Status: DC

## 2022-05-20 MED ORDER — "BD BLUNT FILL NEEDLE 18G X 1-1/2"" MISC": 1 refills | Status: DC

## 2022-05-20 MED ORDER — TESTOSTERONE CYPIONATE 200 MG/ML IM SOLN: 200.0000 mg | INTRAMUSCULAR | 0 refills | Status: DC

## 2022-05-20 NOTE — Telephone Encounter (Signed)
Patient called and left voice  mail - states he was calling back to let Dr. Zigmund Daniel know he is comfortable with doing injections himself rather than coming to the facility once a week. He is requesting that the rx  be sent to  [facility] ( same pharmacy as previous patches were sent -per pt )  phone # [phone]

## 2022-05-20 NOTE — Telephone Encounter (Signed)
Rx for medication and needles sent in.   Thanks! CM

## 2022-05-21 NOTE — Telephone Encounter (Signed)
Patient informed. 

## 2022-09-07 ENCOUNTER — Encounter: Payer: Self-pay | Admitting: Family Medicine

## 2022-09-07 ENCOUNTER — Ambulatory Visit: Payer: Managed Care, Other (non HMO) | Admitting: Family Medicine

## 2022-09-07 VITALS — BP 147/88 | HR 74 | Ht 71.26 in | Wt 216.0 lb

## 2022-09-07 DIAGNOSIS — M25572 Pain in left ankle and joints of left foot: Secondary | ICD-10-CM

## 2022-09-07 DIAGNOSIS — Z20822 Contact with and (suspected) exposure to covid-19: Secondary | ICD-10-CM | POA: Diagnosis not present

## 2022-09-07 DIAGNOSIS — R6882 Decreased libido: Secondary | ICD-10-CM

## 2022-09-07 DIAGNOSIS — J069 Acute upper respiratory infection, unspecified: Secondary | ICD-10-CM | POA: Insufficient documentation

## 2022-09-07 DIAGNOSIS — R7989 Other specified abnormal findings of blood chemistry: Secondary | ICD-10-CM | POA: Diagnosis not present

## 2022-09-07 DIAGNOSIS — A630 Anogenital (venereal) warts: Secondary | ICD-10-CM

## 2022-09-07 DIAGNOSIS — R051 Acute cough: Secondary | ICD-10-CM

## 2022-09-07 DIAGNOSIS — R0981 Nasal congestion: Secondary | ICD-10-CM

## 2022-09-07 DIAGNOSIS — M2550 Pain in unspecified joint: Secondary | ICD-10-CM | POA: Diagnosis not present

## 2022-09-07 DIAGNOSIS — E291 Testicular hypofunction: Secondary | ICD-10-CM

## 2022-09-07 DIAGNOSIS — F411 Generalized anxiety disorder: Secondary | ICD-10-CM

## 2022-09-07 MED ORDER — ALPRAZOLAM 0.5 MG PO TABS: 0.5000 mg | ORAL_TABLET | Freq: Two times a day (BID) | ORAL | 3 refills | Status: DC | PRN

## 2022-09-07 MED ORDER — PODOFILOX 0.5 % EX SOLN: CUTANEOUS | 0 refills | Status: DC

## 2022-09-07 NOTE — Assessment & Plan Note (Signed)
Has had gout before.  Repeating uric acid levels.

## 2022-09-07 NOTE — Progress Notes (Unsigned)
Justin Norris - 52 y.o. male MRN [id number]  Date of birth: [birth year]  Subjective Chief Complaint  Patient presents with   URI   Edema    HPI Justin Norris is a 52 year old male here today for follow-up visit.    Continues to have anxiety regarding family dynamics.  He has consulted attorney regarding possible steps for separation if needed due to continued issues with his wife's sister and mother.  Using alprazolam as needed.  He does not use this very often.  Feels that current dosing of testosterone is working quite well for him.  ED and libido have improved.  He does have continued genital warts.  Aldara did not seem to really improve his symptoms.  He did use podofilox in the past which worked quite well for him.  He has had a respiratory infection with congestion, fever, headache.  Other family which have had similar symptoms.  He has not tested for COVID or flu.  ROS:  A comprehensive ROS was completed and negative except as noted per HPI  Allergies  Allergen Reactions   Coconut Fatty Acids Anaphylaxis    Past Medical History:  Diagnosis Date   ANXIETY 07/15/2007   BACK PAIN, RIGHT 07/10/2010   Headache(784.0) 07/15/2007   NEPHROLITHIASIS, HX OF 07/15/2007    Past Surgical History:  Procedure Laterality Date   HERNIA REPAIR     LIH    Social History   Socioeconomic History   Marital status: Married    Spouse name: Not on file   Number of children: 9   Years of education: Not on file   Highest education level: Not on file  Occupational History   Occupation: Engineer, technical sales: [employer]  Tobacco Use   Smoking status: Former    Years: 20.00    Types: Cigarettes    Quit date: 07/03/2014    Years since quitting: 8.1    Passive exposure: Never   Smokeless tobacco: Never  Vaping Use   Vaping Use: Never used  Substance and Sexual Activity   Alcohol use: No    Alcohol/week: 0.0 standard drinks of alcohol    Comment: rare   Drug use: No    Sexual activity: Yes    Partners: Female  Other Topics Concern   Not on file  Social History Narrative   Not on file   Social Determinants of Health   Financial Resource Strain: Not on file  Food Insecurity: Not on file  Transportation Needs: Not on file  Physical Activity: Not on file  Stress: Not on file  Social Connections: Not on file    Family History  Problem Relation Age of Onset   Heart disease Father    Skin cancer Father     Health Maintenance  Topic Date Due   DTaP/Tdap/Td (1 - Tdap) Never done   Zoster Vaccines- Shingrix (1 of 2) Never done   COVID-19 Vaccine (6 - 2023-24 season) 06/06/2022   INFLUENZA VACCINE  10/18/2022 (Originally 02/17/2022)   COLONOSCOPY (Pts 45-64yr Insurance coverage will need to be confirmed)  03/14/2023 (Originally 06/02/2014)   Hepatitis C Screening  03/14/2023 (Originally 06/03/1987)   HIV Screening  03/14/2023 (Originally 06/02/1984)   HPV VACCINES  Aged Out     ----------------------------------------------------------------------------------------------------------------------------------------------------------------------------------------------------------------- Physical Exam BP (!) 147/88 (BP Location: Left Arm, Patient Position: Sitting, Cuff Size: Large)   Pulse 74   Ht 5' 11.26" (1.81 m)   Wt 216 lb (98 kg)   SpO2 95%  BMI 29.91 kg/m   Physical Exam Constitutional:      Appearance: Normal appearance.  HENT:     Head: Normocephalic and atraumatic.  Eyes:     General: No scleral icterus. Cardiovascular:     Rate and Rhythm: Normal rate and regular rhythm.  Pulmonary:     Effort: Pulmonary effort is normal.     Breath sounds: Normal breath sounds.  Musculoskeletal:     Cervical back: Neck supple.  Neurological:     Mental Status: He is alert.  Psychiatric:        Mood and Affect: Mood normal.        Behavior: Behavior normal.      ------------------------------------------------------------------------------------------------------------------------------------------------------------------------------------------------------------------- Assessment and Plan  Hypogonadism in male Update testosterone levels as well as CBC and PSA today.  Genital warts Podofilox has worked well for him in the past.  Adding this back on.  Discussed caution and discontinuation if having significant skin irritation.  Anxiety state Mostly situational when dealing with his in-laws.  Alprazolam continues to work pretty well for him.  Will continue this at current strength.  He is aware he should not use this very often.  Acute left ankle pain Has had gout before.  Repeating uric acid levels.  URI (upper respiratory infection) Negative flu and COVID.  Symptoms do seem to be resolving at this point.   Meds ordered this encounter  Medications   ALPRAZolam (XANAX) 0.5 MG tablet    Sig: Take 1 tablet (0.5 mg total) by mouth 2 (two) times daily as needed.    Dispense:  45 tablet    Refill:  3    Rx must last 30 days.   podofilox (CONDYLOX) 0.5 % external solution    Sig: Apply topically every 12 hours in the morning and evening for 3 days, then withhold for 4 days; repeat cycle up to 4 times    Dispense:  3.5 mL    Refill:  0    Return in about 3 months (around 12/06/2022) for F/u Testosterone-Please make patient a 40 minute patient. .    This visit occurred during the SARS-CoV-2 public health emergency.  Safety protocols were in place, including screening questions prior to the visit, additional usage of staff PPE, and extensive cleaning of exam room while observing appropriate contact time as indicated for disinfecting solutions.

## 2022-09-07 NOTE — Assessment & Plan Note (Signed)
Negative flu and COVID.  Symptoms do seem to be resolving at this point.

## 2022-09-07 NOTE — Assessment & Plan Note (Signed)
Podofilox has worked well for him in the past.  Adding this back on.  Discussed caution and discontinuation if having significant skin irritation.

## 2022-09-07 NOTE — Assessment & Plan Note (Signed)
Mostly situational when dealing with his in-laws.  Alprazolam continues to work pretty well for him.  Will continue this at current strength.  He is aware he should not use this very often.

## 2022-09-07 NOTE — Assessment & Plan Note (Signed)
Update testosterone levels as well as CBC and PSA today.

## 2022-09-08 LAB — CBC WITH DIFFERENTIAL/PLATELET
Absolute Monocytes: 589 cells/uL (ref 200–950)
Basophils Absolute: 66 cells/uL (ref 0–200)
Basophils Relative: 0.8 %
Eosinophils Absolute: 423 cells/uL (ref 15–500)
Eosinophils Relative: 5.1 %
HCT: 51 % — ABNORMAL HIGH (ref 38.5–50.0)
Hemoglobin: 16.9 g/dL (ref 13.2–17.1)
Lymphs Abs: 1262 cells/uL (ref 850–3900)
MCH: 27 pg (ref 27.0–33.0)
MCHC: 33.1 g/dL (ref 32.0–36.0)
MCV: 81.5 fL (ref 80.0–100.0)
MPV: 10.8 fL (ref 7.5–12.5)
Monocytes Relative: 7.1 %
Neutro Abs: 5959 cells/uL (ref 1500–7800)
Neutrophils Relative %: 71.8 %
Platelets: 341 10*3/uL (ref 140–400)
RBC: 6.26 10*6/uL — ABNORMAL HIGH (ref 4.20–5.80)
RDW: 13.3 % (ref 11.0–15.0)
Total Lymphocyte: 15.2 %
WBC: 8.3 10*3/uL (ref 3.8–10.8)

## 2022-09-08 LAB — URIC ACID: Uric Acid, Serum: 6 mg/dL (ref 4.0–8.0)

## 2022-09-08 LAB — TESTOSTERONE: Testosterone: 432 ng/dL (ref 250–827)

## 2022-09-08 LAB — PSA: PSA: 6.76 ng/mL — ABNORMAL HIGH (ref <=4.00)

## 2022-09-09 LAB — POC COVID19 BINAXNOW: SARS Coronavirus 2 Ag: NEGATIVE

## 2022-09-09 LAB — POCT INFLUENZA A/B
Influenza A, POC: NEGATIVE
Influenza B, POC: NEGATIVE

## 2022-09-09 NOTE — Progress Notes (Signed)
Completed.

## 2022-09-16 ENCOUNTER — Other Ambulatory Visit: Payer: Self-pay | Admitting: Family Medicine

## 2022-09-17 MED ORDER — TESTOSTERONE CYPIONATE 200 MG/ML IM SOLN: 200.0000 mg | INTRAMUSCULAR | 0 refills | Status: DC

## 2022-12-07 ENCOUNTER — Ambulatory Visit: Payer: Managed Care, Other (non HMO) | Admitting: Family Medicine

## 2022-12-07 VITALS — BP 124/75 | HR 72 | Ht 71.26 in | Wt 222.0 lb

## 2022-12-07 DIAGNOSIS — Z23 Encounter for immunization: Secondary | ICD-10-CM | POA: Diagnosis not present

## 2022-12-07 DIAGNOSIS — Z5320 Procedure and treatment not carried out because of patient's decision for unspecified reasons: Secondary | ICD-10-CM

## 2022-12-07 DIAGNOSIS — R972 Elevated prostate specific antigen [PSA]: Secondary | ICD-10-CM

## 2022-12-07 DIAGNOSIS — E291 Testicular hypofunction: Secondary | ICD-10-CM

## 2022-12-07 NOTE — Progress Notes (Signed)
Justin Norris - 52 y.o. male MRN [id number]  Date of birth: 04-20-1971  Subjective Chief Complaint  Patient presents with   Follow-up    HPI Justin Norris is a 52 y.o. male here today for follow up visit.   He is currently on TRT with injectable testosterone at 200mg  every 2 weeks.  He has felt good with current dosing and strength.  His PSA was elevated at last visit.  Plan to recheck today.    He is due for Tdap and Shingrix vaccine.   He is unsure about having colonoscopy.  Discussed availability of Cologuard.  He would like to continue to defer this at this time.  ROS:  A comprehensive ROS was completed and negative except as noted per HPI  Allergies  Allergen Reactions   Coconut Fatty Acids Anaphylaxis    Past Medical History:  Diagnosis Date   ANXIETY 07/15/2007   BACK PAIN, RIGHT 07/10/2010   Headache(784.0) 07/15/2007   NEPHROLITHIASIS, HX OF 07/15/2007    Past Surgical History:  Procedure Laterality Date   HERNIA REPAIR     LIH    Social History   Socioeconomic History   Marital status: Married    Spouse name: Not on file   Number of children: 9   Years of education: Not on file   Highest education level: Bachelor's degree (e.g., BA, AB, BS)  Occupational History   Occupation: Environmental health practitioner: [employer]  Tobacco Use   Smoking status: Former    Years: 20    Types: Cigarettes    Quit date: 07/03/2014    Years since quitting: 8.4    Passive exposure: Never   Smokeless tobacco: Never  Vaping Use   Vaping Use: Never used  Substance and Sexual Activity   Alcohol use: No    Alcohol/week: 0.0 standard drinks of alcohol    Comment: rare   Drug use: No   Sexual activity: Yes    Partners: Female  Other Topics Concern   Not on file  Social History Narrative   Not on file   Social Determinants of Health   Financial Resource Strain: Low Risk  (12/07/2022)   Overall Financial Resource Strain (CARDIA)    Difficulty of Paying  Living Expenses: Not hard at all  Food Insecurity: No Food Insecurity (12/07/2022)   Hunger Vital Sign    Worried About Running Out of Food in the Last Year: Never true    Ran Out of Food in the Last Year: Never true  Transportation Needs: No Transportation Needs (12/07/2022)   PRAPARE - Administrator, Civil Service (Medical): No    Lack of Transportation (Non-Medical): No  Physical Activity: Sufficiently Active (12/07/2022)   Exercise Vital Sign    Days of Exercise per Week: 5 days    Minutes of Exercise per Session: 90 min  Stress: Stress Concern Present (12/07/2022)   [employer] of Occupational Health - Occupational Stress Questionnaire    Feeling of Stress : To some extent  Social Connections: Socially Integrated (12/07/2022)   Social Connection and Isolation Panel [NHANES]    Frequency of Communication with Friends and Family: More than three times a week    Frequency of Social Gatherings with Friends and Family: Once a week    Attends Religious Services: More than 4 times per year    Active Member of [employer] or Organizations: Yes    Attends Banker Meetings: More than 4 times per  year    Marital Status: Married    Family History  Problem Relation Age of Onset   Heart disease Father    Skin cancer Father     Health Maintenance  Topic Date Due   COVID-19 Vaccine (6 - 2023-24 season) 06/06/2022   COLONOSCOPY (Pts 45-17yrs Insurance coverage will need to be confirmed)  03/14/2023 (Originally 06/02/2014)   Hepatitis C Screening  03/14/2023 (Originally 06/03/1987)   HIV Screening  03/14/2023 (Originally 06/02/1984)   Zoster Vaccines- Shingrix (2 of 2) 02/01/2023   INFLUENZA VACCINE  02/18/2023   DTaP/Tdap/Td (2 - Td or Tdap) 12/06/2032   HPV VACCINES  Aged Out      ----------------------------------------------------------------------------------------------------------------------------------------------------------------------------------------------------------------- Physical Exam BP 124/75 (BP Location: Left Arm, Patient Position: Sitting, Cuff Size: Normal)   Pulse 72   Ht 5' 11.26" (1.81 m)   Wt 222 lb (100.7 kg)   SpO2 96%   BMI 30.74 kg/m   Physical Exam Constitutional:      Appearance: Normal appearance.  HENT:     Head: Normocephalic and atraumatic.  Eyes:     General: No scleral icterus. Neurological:     Mental Status: He is alert.  Psychiatric:        Mood and Affect: Mood normal.        Behavior: Behavior normal.     ------------------------------------------------------------------------------------------------------------------------------------------------------------------------------------------------------------------- Assessment and Plan  Hypogonadism in male He is doing pretty well with testosterone replacement however his PSA was elevated previously.  Rechecking today.  Colon cancer screening declined Defers colon cancer screening for now.  Will revisit at next visit.   No orders of the defined types were placed in this encounter.   Return in about 3 months (around 03/09/2023) for F/u Testosterone.    This visit occurred during the SARS-CoV-2 public health emergency.  Safety protocols were in place, including screening questions prior to the visit, additional usage of staff PPE, and extensive cleaning of exam room while observing appropriate contact time as indicated for disinfecting solutions.

## 2022-12-07 NOTE — Assessment & Plan Note (Signed)
He is doing pretty well with testosterone replacement however his PSA was elevated previously.  Rechecking today.

## 2022-12-07 NOTE — Assessment & Plan Note (Signed)
Defers colon cancer screening for now.  Will revisit at next visit.

## 2022-12-08 ENCOUNTER — Other Ambulatory Visit: Payer: Self-pay | Admitting: Family Medicine

## 2022-12-08 DIAGNOSIS — R972 Elevated prostate specific antigen [PSA]: Secondary | ICD-10-CM

## 2022-12-08 LAB — PSA: PSA: 7.48 ng/mL — ABNORMAL HIGH (ref <=4.00)

## 2022-12-23 ENCOUNTER — Encounter: Payer: Self-pay | Admitting: Urology

## 2022-12-23 ENCOUNTER — Ambulatory Visit: Payer: Managed Care, Other (non HMO) | Admitting: Urology

## 2022-12-23 ENCOUNTER — Other Ambulatory Visit: Payer: Self-pay | Admitting: Urology

## 2022-12-23 VITALS — BP 147/100 | HR 87 | Ht 71.0 in | Wt 209.0 lb

## 2022-12-23 DIAGNOSIS — Z8042 Family history of malignant neoplasm of prostate: Secondary | ICD-10-CM

## 2022-12-23 DIAGNOSIS — E291 Testicular hypofunction: Secondary | ICD-10-CM | POA: Diagnosis not present

## 2022-12-23 DIAGNOSIS — R972 Elevated prostate specific antigen [PSA]: Secondary | ICD-10-CM

## 2022-12-23 LAB — URINALYSIS, ROUTINE W REFLEX MICROSCOPIC
Bilirubin, UA: NEGATIVE
Glucose, UA: NEGATIVE
Ketones, UA: NEGATIVE
Leukocytes,UA: NEGATIVE
Nitrite, UA: NEGATIVE
Protein,UA: NEGATIVE
RBC, UA: NEGATIVE
Specific Gravity, UA: 1.025 (ref 1.005–1.030)
Urobilinogen, Ur: 1 mg/dL (ref 0.2–1.0)
pH, UA: 6.5 (ref 5.0–7.5)

## 2022-12-23 NOTE — Addendum Note (Signed)
Addended by: Lizbeth Bark on: 12/23/2022 11:48 AM   Modules accepted: Orders

## 2022-12-23 NOTE — Progress Notes (Signed)
Assessment: 1. Elevated PSA   2. Hypogonadism in male   3. Family history of prostate cancer in father     Plan: I personally reviewed the patient's chart including provider notes, and lab results. Today I had a long discussion with the patient regarding PSA and the rationale and controversies of prostate cancer early detection.  I discussed the pros and cons of further evaluation including TRUS and prostate Bx.  Potential adverse events and complications as well as standard instructions were given.  Patient expressed his understanding of these issues. Schedule for prostate ultrasound and biopsy. Recommend discontinuing testosterone injections until further evaluation of elevated PSA.  Chief Complaint:  Chief Complaint  Patient presents with   Elevated PSA    History of Present Illness:  Justin Norris is a 52 y.o. male who is seen in consultation from [name], DO for evaluation of elevated PSA.  PSA results: 2/24 6.76 5/24 7.48  No history of UTIs or prostatitis.  His father was diagnosed with prostate cancer in his 70s and was treated with brachytherapy.  He was diagnosed with low testosterone and has been treated with testosterone replacement since February 2024.  He has been using testosterone injections every 2 weeks.  He is also using tadalafil 20 mg as needed for erectile dysfunction.  He reports improvement in his libido and in his erectile function with testosterone replacement.  He has a history of a left undescended testicle undergoing left orchiopexy in 1977.  Past Medical History:  Past Medical History:  Diagnosis Date   ANXIETY 07/15/2007   BACK PAIN, RIGHT 07/10/2010   Headache(784.0) 07/15/2007   NEPHROLITHIASIS, HX OF 07/15/2007    Past Surgical History:  Past Surgical History:  Procedure Laterality Date   HERNIA REPAIR     LIH    Allergies:  Allergies  Allergen Reactions   Coconut Fatty Acids Anaphylaxis    Family History:  Family  History  Problem Relation Age of Onset   Heart disease Father    Skin cancer Father     Social History:  Social History   Tobacco Use   Smoking status: Former    Years: 20    Types: Cigarettes    Quit date: 07/03/2014    Years since quitting: 8.4    Passive exposure: Never   Smokeless tobacco: Never  Vaping Use   Vaping Use: Never used  Substance Use Topics   Alcohol use: No    Alcohol/week: 0.0 standard drinks of alcohol    Comment: rare   Drug use: No    Review of symptoms:  Constitutional:  Negative for unexplained weight loss, night sweats, fever, chills ENT:  Negative for nose bleeds, sinus pain, painful swallowing CV:  Negative for chest pain, shortness of breath, exercise intolerance, palpitations, loss of consciousness Resp:  Negative for cough, wheezing, shortness of breath GI:  Negative for nausea, vomiting, diarrhea, bloody stools GU:  Positives noted in HPI; otherwise negative for gross hematuria, dysuria, urinary incontinence Neuro:  Negative for seizures, poor balance, limb weakness, slurred speech Psych:  Negative for lack of energy, depression, anxiety Endocrine:  Negative for polydipsia, polyuria, symptoms of hypoglycemia (dizziness, hunger, sweating) Hematologic:  Negative for anemia, purpura, petechia, prolonged or excessive bleeding, use of anticoagulants  Allergic:  Negative for difficulty breathing or choking as a result of exposure to anything; no shellfish allergy; no allergic response (rash/itch) to materials, foods  Physical exam: BP (!) 147/100   Pulse 87   Ht 5'  11" (1.803 m)   Wt 209 lb (94.8 kg)   BMI 29.15 kg/m  GENERAL APPEARANCE:  Well appearing, well developed, well nourished, NAD HEENT: Atraumatic, Normocephalic, oropharynx clear. NECK: Supple without lymphadenopathy or thyromegaly. LUNGS: Clear to auscultation bilaterally. HEART: Regular Rate and Rhythm without murmurs, gallops, or rubs. ABDOMEN: Soft, non-tender, No  Masses. EXTREMITIES: Moves all extremities well.  Without clubbing, cyanosis, or edema. NEUROLOGIC:  Alert and oriented x 3, normal gait, CN II-XII grossly intact.  MENTAL STATUS:  Appropriate. BACK:  Non-tender to palpation.  No CVAT SKIN:  Warm, dry and intact.   GU: Penis:  circumcised Meatus: Normal Scrotum: no erythema or edema Testis: Right normal; left, slightly smaller in size, NT, no mass Epididymis: normal Prostate: 30 g, NT, no nodules Rectum: Normal tone,  no masses or tenderness   Results: U/A: negative

## 2023-01-06 ENCOUNTER — Telehealth: Payer: Managed Care, Other (non HMO) | Admitting: Family Medicine

## 2023-01-06 ENCOUNTER — Encounter: Payer: Self-pay | Admitting: Family Medicine

## 2023-01-06 VITALS — Ht 71.0 in | Wt 209.0 lb

## 2023-01-06 DIAGNOSIS — H109 Unspecified conjunctivitis: Secondary | ICD-10-CM | POA: Diagnosis not present

## 2023-01-06 MED ORDER — MOXIFLOXACIN HCL 0.5 % OP SOLN: 1.0000 [drp] | Freq: Three times a day (TID) | OPHTHALMIC | 0 refills | Status: DC

## 2023-01-06 NOTE — Assessment & Plan Note (Signed)
Likely with underlying viral illness however will cover for bacterial infection given increased thickened secretions.  Prescription for Vigamox drops sent in.

## 2023-01-06 NOTE — Progress Notes (Signed)
Justin Norris - 52 y.o. male MRN [id number]  Date of birth: Jan 15, 1971    All issues noted in this document were discussed and addressed.  No physical exam was performed (except for noted visual exam findings with Video Visits).  I discussed the limitations of evaluation and management by telemedicine and the availability of in person appointments. The patient expressed understanding and agreed to proceed.  I connected withNAME@ on 01/06/23 at  3:10 PM EDT by a video enabled telemedicine application and verified that I am speaking with the correct person using two identifiers.  Present at visit: [name], DO Hettie Holstein   Patient Location: Home [address] [place] [address]   Provider location:   [facility]  No chief complaint on file.   HPI  Justin Norris is a 52 y.o. male who presents via audio/video conferencing for a telehealth visit today.  He reports 2-day history of conjunctivitis.  Has had eye irritation with matting and crusting.  He has had some congestion as well as fatigue.  Denies fever or chills.  He and his family spent the [facility] and most of his family has similar symptoms.  He denies any vision changes or ocular pain.   ROS:  A comprehensive ROS was completed and negative except as noted per HPI  Past Medical History:  Diagnosis Date   ANXIETY 07/15/2007   BACK PAIN, RIGHT 07/10/2010   Headache(784.0) 07/15/2007   NEPHROLITHIASIS, HX OF 07/15/2007    Past Surgical History:  Procedure Laterality Date   HERNIA REPAIR     LIH    Family History  Problem Relation Age of Onset   Heart disease Father    Skin cancer Father     Social History   Socioeconomic History   Marital status: Married    Spouse name: Not on file   Number of children: 9   Years of education: Not on file   Highest education level: Bachelor's degree (e.g., BA, AB, BS)  Occupational History   Occupation: Environmental health practitioner: [employer]  Tobacco Use   Smoking status: Former    Years: 20    Types: Cigarettes    Quit date: 07/03/2014    Years since quitting: 8.5    Passive exposure: Never   Smokeless tobacco: Never  Vaping Use   Vaping Use: Never used  Substance and Sexual Activity   Alcohol use: No    Alcohol/week: 0.0 standard drinks of alcohol    Comment: rare   Drug use: No   Sexual activity: Yes    Partners: Female  Other Topics Concern   Not on file  Social History Narrative   Not on file   Social Determinants of Health   Financial Resource Strain: Low Risk  (12/07/2022)   Overall Financial Resource Strain (CARDIA)    Difficulty of Paying Living Expenses: Not hard at all  Food Insecurity: No Food Insecurity (12/07/2022)   Hunger Vital Sign    Worried About Running Out of Food in the Last Year: Never true    Ran Out of Food in the Last Year: Never true  Transportation Needs: No Transportation Needs (12/07/2022)   PRAPARE - Administrator, Civil Service (Medical): No    Lack of Transportation (Non-Medical): No  Physical Activity: Sufficiently Active (12/07/2022)   Exercise Vital Sign    Days of Exercise per Week: 5 days    Minutes of Exercise per Session:  90 min  Stress: Stress Concern Present (12/07/2022)   [employer] of Occupational Health - Occupational Stress Questionnaire    Feeling of Stress : To some extent  Social Connections: Socially Integrated (12/07/2022)   Social Connection and Isolation Panel [NHANES]    Frequency of Communication with Friends and Family: More than three times a week    Frequency of Social Gatherings with Friends and Family: Once a week    Attends Religious Services: More than 4 times per year    Active Member of [employer] or Organizations: Yes    Attends Engineer, structural: More than 4 times per year    Marital Status: Married  Catering manager Violence: Not on file     Current Outpatient Medications:    ALPRAZolam (XANAX) 0.5  MG tablet, Take 1 tablet (0.5 mg total) by mouth 2 (two) times daily as needed., Disp: 45 tablet, Rfl: 3   moxifloxacin (VIGAMOX) 0.5 % ophthalmic solution, Place 1 drop into both eyes 3 (three) times daily., Disp: 3 mL, Rfl: 0   podofilox (CONDYLOX) 0.5 % external solution, Apply topically every 12 hours in the morning and evening for 3 days, then withhold for 4 days; repeat cycle up to 4 times, Disp: 3.5 mL, Rfl: 0   SYRINGE-NEEDLE, DISP, 3 ML 23G X 1" 3 ML MISC, Use to inject testosterone., Disp: 50 each, Rfl: 1   tadalafil (CIALIS) 20 MG tablet, Take 1 tablet (20 mg total) by mouth daily as needed for erectile dysfunction., Disp: 30 tablet, Rfl: 3   testosterone cypionate (DEPOTESTOSTERONE CYPIONATE) 200 MG/ML injection, Inject 1 mL (200 mg total) into the muscle every 14 (fourteen) days., Disp: 10 mL, Rfl: 0  EXAM:  VITALS per patient if applicable: Ht 5\' 11"  (1.803 m)   Wt 209 lb (94.8 kg)   BMI 29.15 kg/m   GENERAL: alert, oriented, appears well and in no acute distress  HEENT: atraumatic, bilateral conjunctival injection.  No obvious abnormalities on inspection of external nose and ears  NECK: normal movements of the head and neck  LUNGS: on inspection no signs of respiratory distress, breathing rate appears normal, no obvious gross SOB, gasping or wheezing  CV: no obvious cyanosis  MS: moves all visible extremities without noticeable abnormality  PSYCH/NEURO: pleasant and cooperative, no obvious depression or anxiety, speech and thought processing grossly intact  ASSESSMENT AND PLAN:  Discussed the following assessment and plan:  Conjunctivitis Likely with underlying viral illness however will cover for bacterial infection given increased thickened secretions.  Prescription for Vigamox drops sent in.     I discussed the assessment and treatment plan with the patient. The patient was provided an opportunity to ask questions and all were answered. The patient agreed with  the plan and demonstrated an understanding of the instructions.   The patient was advised to call back or seek an in-person evaluation if the symptoms worsen or if the condition fails to improve as anticipated.    [name], DO  Who

## 2023-01-06 NOTE — Progress Notes (Signed)
Symptoms started on Sunday. Red, oozing, crusty eyes. Returning from [facility]. Entire family has the symptoms.

## 2023-01-13 ENCOUNTER — Ambulatory Visit: Payer: Managed Care, Other (non HMO) | Admitting: Urology

## 2023-01-13 ENCOUNTER — Encounter: Payer: Self-pay | Admitting: Urology

## 2023-01-13 ENCOUNTER — Ambulatory Visit (HOSPITAL_BASED_OUTPATIENT_CLINIC_OR_DEPARTMENT_OTHER): Payer: Managed Care, Other (non HMO)

## 2023-01-13 ENCOUNTER — Ambulatory Visit (HOSPITAL_BASED_OUTPATIENT_CLINIC_OR_DEPARTMENT_OTHER)
Admission: RE | Admit: 2023-01-13 | Discharge: 2023-01-13 | Disposition: A | Discharge status: Home / Self Care | Payer: Managed Care, Other (non HMO) | Source: Ambulatory Visit | Attending: Urology | Admitting: Urology

## 2023-01-13 VITALS — BP 129/88 | HR 64 | Ht 71.0 in | Wt 208.0 lb

## 2023-01-13 DIAGNOSIS — R972 Elevated prostate specific antigen [PSA]: Secondary | ICD-10-CM

## 2023-01-13 DIAGNOSIS — C61 Malignant neoplasm of prostate: Secondary | ICD-10-CM | POA: Diagnosis not present

## 2023-01-13 DIAGNOSIS — E291 Testicular hypofunction: Secondary | ICD-10-CM

## 2023-01-13 DIAGNOSIS — Z2989 Encounter for other specified prophylactic measures: Secondary | ICD-10-CM | POA: Diagnosis not present

## 2023-01-13 DIAGNOSIS — Z8042 Family history of malignant neoplasm of prostate: Secondary | ICD-10-CM

## 2023-01-13 LAB — URINALYSIS, ROUTINE W REFLEX MICROSCOPIC
Bilirubin, UA: NEGATIVE
Glucose, UA: NEGATIVE
Ketones, UA: NEGATIVE
Leukocytes,UA: NEGATIVE
Nitrite, UA: NEGATIVE
Protein,UA: NEGATIVE
RBC, UA: NEGATIVE
Specific Gravity, UA: 1.02 (ref 1.005–1.030)
Urobilinogen, Ur: 0.2 mg/dL (ref 0.2–1.0)
pH, UA: 7.5 (ref 5.0–7.5)

## 2023-01-13 MED ORDER — CEFTRIAXONE SODIUM 1 G IJ SOLR
1.0000 g | Freq: Once | INTRAMUSCULAR | Status: AC
  Administered 2023-01-13: 1 g via INTRAMUSCULAR

## 2023-01-13 NOTE — Progress Notes (Signed)
IM Injection  Patient is present today for an IM Injection for treatment of infection prevention post prostate biopsy Drug: Ceftriaxone Dose:1g Location:left upper outer buttocks Lot: [id number] Exp:09/2024 Patient tolerated well, no complications were noted  Performed by: Jimmye Norman., CMA

## 2023-01-13 NOTE — Progress Notes (Signed)
Assessment: 1. Elevated PSA   2. Family history of prostate cancer in father   3. Hypogonadism in male     Plan: Post biopsy instructions given Return to office in 7-10 days for biopsy results Recommend discontinuing testosterone injections until further evaluation of elevated PSA.  Chief Complaint:  Chief Complaint  Patient presents with   Prostate Biopsy    History of Present Illness:  Justin Norris is a 52 y.o. male who is seen for further evaluation of elevated PSA.  PSA results: 2/24 6.76 5/24 7.48  No history of UTIs or prostatitis.  His father was diagnosed with prostate cancer in his 12s and was treated with brachytherapy.  He was diagnosed with low testosterone and has been treated with testosterone replacement since February 2024.  He has been using testosterone injections every 2 weeks.  He is also using tadalafil 20 mg as needed for erectile dysfunction.  He reports improvement in his libido and in his erectile function with testosterone replacement.  He has a history of a left undescended testicle undergoing left orchiopexy in 1977.  He presents today for prostate biopsy.  Portions of the above documentation were copied from a prior visit for review purposes only.   Past Medical History:  Past Medical History:  Diagnosis Date   ANXIETY 07/15/2007   BACK PAIN, RIGHT 07/10/2010   Headache(784.0) 07/15/2007   NEPHROLITHIASIS, HX OF 07/15/2007    Past Surgical History:  Past Surgical History:  Procedure Laterality Date   HERNIA REPAIR     LIH    Allergies:  Allergies  Allergen Reactions   Coconut Fatty Acids Anaphylaxis    Family History:  Family History  Problem Relation Age of Onset   Heart disease Father    Skin cancer Father     Social History:  Social History   Tobacco Use   Smoking status: Former    Years: 20    Types: Cigarettes    Quit date: 07/03/2014    Years since quitting: 8.5    Passive exposure: Never   Smokeless  tobacco: Never  Vaping Use   Vaping Use: Never used  Substance Use Topics   Alcohol use: No    Alcohol/week: 0.0 standard drinks of alcohol    Comment: rare   Drug use: No    ROS: Constitutional:  Negative for fever, chills, weight loss CV: Negative for chest pain, previous MI, hypertension Respiratory:  Negative for shortness of breath, wheezing, sleep apnea, frequent cough GI:  Negative for nausea, vomiting, bloody stool, GERD  Physical exam: BP 129/88   Pulse 64   Ht 5\' 11"  (1.803 m)   Wt 208 lb (94.3 kg)   BMI 29.01 kg/m  GENERAL APPEARANCE:  Well appearing, well developed, well nourished, NAD HEENT:  Atraumatic, normocephalic, oropharynx clear NECK:  Supple without lymphadenopathy or thyromegaly ABDOMEN:  Soft, non-tender, no masses EXTREMITIES:  Moves all extremities well, without clubbing, cyanosis, or edema NEUROLOGIC:  Alert and oriented x 3, normal gait, CN II-XII grossly intact MENTAL STATUS:  appropriate BACK:  Non-tender to palpation, No CVAT SKIN:  Warm, dry, and intact   Results: U/A: negative  TRANSRECTAL ULTRASOUND AND PROSTATE BIOPSY  Indication:  Elevated PSA  Prophylactic antibiotic administration: Rocephin  All medications that could result in increased bleeding were discontinued within an appropriate period of the time of biopsy.  Risk including bleeding and infection were discussed.  Informed consent was obtained.  The patient was placed in the left lateral decubitus  position.  PROCEDURE 1.  TRANSRECTAL ULTRASOUND OF THE PROSTATE  The 7 MHz transrectal probe was used to image the prostate.  Anal stenosis was not noted.  TRUS volume: 27 ml  Hypoechoic areas: None  Hyperechoic areas: None  Central calcifications: not present  Margins:  normal  Seminal Vesicles: normal   PROCEDURE 2:  PROSTATE BIOPSY  A periprostatic block was performed using 1% lidocaine and transrectal ultrasound guidance. Under transrectal ultrasound guidance,  and using the Biopty gun, prostate biopsies were obtained systematically from the apex, mid gland, and base bilaterally.  A total of 12 cores were obtained.  Hemostasis was obtained with gentle pressure on the prostate.  The procedures were well-tolerated.  No significant bleeding was noted at the end of the procedure.  The patient was stable for discharge from the office.

## 2023-01-14 ENCOUNTER — Ambulatory Visit: Payer: Managed Care, Other (non HMO) | Admitting: Urology

## 2023-01-20 ENCOUNTER — Ambulatory Visit: Payer: Managed Care, Other (non HMO) | Admitting: Urology

## 2023-01-20 ENCOUNTER — Encounter: Payer: Self-pay | Admitting: Urology

## 2023-01-20 VITALS — BP 146/90 | HR 67 | Ht 71.0 in | Wt 206.0 lb

## 2023-01-20 DIAGNOSIS — Z8042 Family history of malignant neoplasm of prostate: Secondary | ICD-10-CM

## 2023-01-20 DIAGNOSIS — E291 Testicular hypofunction: Secondary | ICD-10-CM | POA: Diagnosis not present

## 2023-01-20 DIAGNOSIS — C61 Malignant neoplasm of prostate: Secondary | ICD-10-CM | POA: Diagnosis not present

## 2023-01-20 LAB — URINALYSIS, ROUTINE W REFLEX MICROSCOPIC
Bilirubin, UA: NEGATIVE
Glucose, UA: NEGATIVE
Ketones, UA: NEGATIVE
Leukocytes,UA: NEGATIVE
Nitrite, UA: NEGATIVE
Protein,UA: NEGATIVE
Specific Gravity, UA: 1.025 (ref 1.005–1.030)
Urobilinogen, Ur: 0.2 mg/dL (ref 0.2–1.0)
pH, UA: 7 (ref 5.0–7.5)

## 2023-01-20 LAB — MICROSCOPIC EXAMINATION
Cast Type: NONE SEEN
Casts: NONE SEEN /lpf
Crystal Type: NONE SEEN
Crystals: NONE SEEN
Epithelial Cells (non renal): NONE SEEN /hpf (ref 0–10)
Renal Epithel, UA: NONE SEEN /hpf
Trichomonas, UA: NONE SEEN
Yeast, UA: NONE SEEN

## 2023-01-20 NOTE — Progress Notes (Addendum)
Assessment: 1. Prostate cancer (HCC); PSA 7.48; GG 1,2; unfavorable intermediate risk (>50% cores +)   2. Family history of prostate cancer in father   3. Hypogonadism in male     Plan: I spent a total of 60 minutes counseling Justin Norris regarding the diagnosis of localized prostate cancer.  I spent the first 20 minutes discussing the biopsy results.  Using the [facility] prostate cancer nomogram, I cited a probability of 53 percent for localized prostate cancer, 46 percent of extracapsular extension, 7 percent of seminal vesicle involvement, and 7 percent of lymph node involvement.  I discussed the diagnosis of localized prostate cancer in the natural history of prostate cancer. I then spent the next 40 minutes discussing treatment options for localized prostate cancer.  Specifically, I discussed active surveillance, radical prostatectomy (RALP, RRP, RPP), external beam radiation, low dose rate brachytherapy, cryosurgery, and high intensity frequency ultrasound.  I discussed the risk and benefits of each treatment.  I discussed the potential risk of impotence and incontinence as they relate to treatment of prostate cancer.  Questions were answered.  The patient was given literature regarding prostate cancer to review.  Recommend discontinuing testosterone injections until definitive treatment of prostate cancer PSMA PET scan ordered for staging of unfavorable intermediate risk prostate cancer.  Addendum 02/25/23: PSMA PET scan showed no evidence of metastatic disease. Results discussed with the patient. He is interested in brachytherapy for treatment of his prostate cancer.  I discussed my concerns with this treatment modality given his unfavorable intermediate risk prostate cancer. I recommended further evaluation with a decipher test to further cyst with treatment decisions and management of the patient's prostate cancer. Orders for Decipher placed today. Referral to radiation oncology  placed.  Chief Complaint:  Chief Complaint  Patient presents with   Results    History of Present Illness:  Justin Norris is a 52 y.o. male who is seen for discussion of prostate biopsy results. He was found to have an elevated PSA. PSA results: 2/24 6.76 5/24 7.48  No history of UTIs or prostatitis.  His father was diagnosed with prostate cancer in his 49s and was treated with brachytherapy.  He was diagnosed with low testosterone and has been treated with testosterone replacement since February 2024.  He has been using testosterone injections every 2 weeks.  He is also using tadalafil 20 mg as needed for erectile dysfunction.  He reports improvement in his libido and in his erectile function with testosterone replacement.  He has a history of a left undescended testicle undergoing left orchiopexy in 1977.  He returns today following his transrectal ultrasound and biopsy of the prostate on 01/13/23. TRUS volume:  27 ml  PSA density:  0.27 Biopsy results:             Gleason score: 3+ 4 = 7, 3 + 3 = 6            # positive cores: 3/6 on right    5/6 on left            Location of cancer: apex, mid gland, and base  Complications after biopsy: none Patient without significant LUTS.    IPSS = 10 Patient without erectile dysfunction.    Portions of the above documentation were copied from a prior visit for review purposes only.   Past Medical History:  Past Medical History:  Diagnosis Date   ANXIETY 07/15/2007   BACK PAIN, RIGHT 07/10/2010   Headache(784.0) 07/15/2007   NEPHROLITHIASIS,  HX OF 07/15/2007    Past Surgical History:  Past Surgical History:  Procedure Laterality Date   HERNIA REPAIR     LIH    Allergies:  Allergies  Allergen Reactions   Coconut Fatty Acids Anaphylaxis    Family History:  Family History  Problem Relation Age of Onset   Heart disease Father    Skin cancer Father     Social History:  Social History   Tobacco Use   Smoking  status: Former    Years: 20    Types: Cigarettes    Quit date: 07/03/2014    Years since quitting: 8.5    Passive exposure: Never   Smokeless tobacco: Never  Vaping Use   Vaping Use: Never used  Substance Use Topics   Alcohol use: No    Alcohol/week: 0.0 standard drinks of alcohol    Comment: rare   Drug use: No    ROS: Constitutional:  Negative for fever, chills, weight loss CV: Negative for chest pain, previous MI, hypertension Respiratory:  Negative for shortness of breath, wheezing, sleep apnea, frequent cough GI:  Negative for nausea, vomiting, bloody stool, GERD  Physical exam: BP (!) 146/90   Pulse 67   Ht 5\' 11"  (1.803 m)   Wt 206 lb (93.4 kg)   BMI 28.73 kg/m  GENERAL APPEARANCE:  Well appearing, well developed, well nourished, NAD HEENT:  Atraumatic, normocephalic, oropharynx clear NECK:  Supple without lymphadenopathy or thyromegaly ABDOMEN:  Soft, non-tender, no masses EXTREMITIES:  Moves all extremities well, without clubbing, cyanosis, or edema NEUROLOGIC:  Alert and oriented x 3, normal gait, CN II-XII grossly intact MENTAL STATUS:  appropriate BACK:  Non-tender to palpation, No CVAT SKIN:  Warm, dry, and intact   Results: U/A: 0-5 WBC, 0-2 RBC

## 2023-01-25 ENCOUNTER — Encounter: Payer: Self-pay | Admitting: Urology

## 2023-02-04 ENCOUNTER — Telehealth: Payer: Self-pay | Admitting: Urology

## 2023-02-04 NOTE — Telephone Encounter (Signed)
Patient faxed [place] paperwork to send to his insurance company. Was following up to see if we have received the form and if it had been filled out and faxed back.

## 2023-02-11 ENCOUNTER — Encounter (HOSPITAL_COMMUNITY)
Admission: RE | Admit: 2023-02-11 | Discharge: 2023-02-11 | Disposition: A | Discharge status: Home / Self Care | Payer: Managed Care, Other (non HMO) | Source: Ambulatory Visit | Attending: Urology | Admitting: Urology

## 2023-02-11 DIAGNOSIS — C61 Malignant neoplasm of prostate: Secondary | ICD-10-CM | POA: Diagnosis not present

## 2023-02-11 MED ORDER — PIFLIFOLASTAT F 18 (PYLARIFY) INJECTION
9.0000 | Freq: Once | INTRAVENOUS | Status: AC
  Administered 2023-02-11: 9.28 via INTRAVENOUS

## 2023-02-25 ENCOUNTER — Other Ambulatory Visit: Payer: Self-pay | Admitting: Urology

## 2023-02-25 ENCOUNTER — Telehealth: Payer: Self-pay

## 2023-02-25 DIAGNOSIS — C61 Malignant neoplasm of prostate: Secondary | ICD-10-CM

## 2023-02-25 NOTE — Telephone Encounter (Signed)
Spoke with patient, he stated that he wants to proceed with the "seeded radiation" treatment.

## 2023-03-08 ENCOUNTER — Encounter: Payer: Self-pay | Admitting: Radiation Oncology

## 2023-03-08 NOTE — Progress Notes (Signed)
GU Location of Tumor / Histology: Prostate Ca  If Prostate Cancer, Gleason Score is (3 + 4) and PSA is (7.48 on 12/07/2022)  PSA 6.76 on 08/2022   Biopsy results:               02/11/2023 Dr. Di Kindle NM PET (PSMA) Skull to Mid Thigh CLINICAL DATA: Staging of high-risk prostate cancer. PSA of 7. 5.  IMPRESSION: 1. Low-level nonspecific prostatic tracer uptake may represent site or sites of primary. 2. No evidence of tracer avid nodal, distant, or osseous metastasis. 3. Incidental findings, including: Mild hepatic steatosis. Aortic Atherosclerosis (ICD10-I70.0). Sinus disease.    Past/Anticipated interventions by urology, if any: NA  Past/Anticipated interventions by medical oncology, if any: NA  Weight changes, if any: {:18581}  IPSS: SHIM:  Bowel/Bladder complaints, if any: {:18581}   Nausea/Vomiting, if any: {:18581}  Pain issues, if any:  {:18581}  SAFETY ISSUES: Prior radiation? {:18581} Pacemaker/ICD? {:18581} Possible current pregnancy? Male Is the patient on methotrexate? No  Current Complaints / other details:

## 2023-03-10 ENCOUNTER — Ambulatory Visit: Payer: Managed Care, Other (non HMO) | Admitting: Family Medicine

## 2023-03-10 ENCOUNTER — Ambulatory Visit
Admission: RE | Admit: 2023-03-10 | Discharge: 2023-03-10 | Disposition: A | Discharge status: Home / Self Care | Payer: Managed Care, Other (non HMO) | Source: Ambulatory Visit | Attending: Radiation Oncology | Admitting: Radiation Oncology

## 2023-03-10 ENCOUNTER — Encounter: Payer: Self-pay | Admitting: Radiation Oncology

## 2023-03-10 ENCOUNTER — Encounter: Payer: Self-pay | Admitting: Family Medicine

## 2023-03-10 ENCOUNTER — Telehealth: Payer: Self-pay | Admitting: Urology

## 2023-03-10 VITALS — BP 142/98 | HR 75 | Temp 97.7°F | Resp 18 | Ht 71.0 in | Wt 211.0 lb

## 2023-03-10 VITALS — BP 123/78 | HR 69 | Ht 71.0 in | Wt 211.0 lb

## 2023-03-10 DIAGNOSIS — K760 Fatty (change of) liver, not elsewhere classified: Secondary | ICD-10-CM | POA: Insufficient documentation

## 2023-03-10 DIAGNOSIS — Z87891 Personal history of nicotine dependence: Secondary | ICD-10-CM | POA: Insufficient documentation

## 2023-03-10 DIAGNOSIS — Z79899 Other long term (current) drug therapy: Secondary | ICD-10-CM | POA: Diagnosis not present

## 2023-03-10 DIAGNOSIS — I700 Atherosclerosis of aorta: Secondary | ICD-10-CM | POA: Insufficient documentation

## 2023-03-10 DIAGNOSIS — E291 Testicular hypofunction: Secondary | ICD-10-CM | POA: Diagnosis not present

## 2023-03-10 DIAGNOSIS — R6882 Decreased libido: Secondary | ICD-10-CM

## 2023-03-10 DIAGNOSIS — C61 Malignant neoplasm of prostate: Secondary | ICD-10-CM

## 2023-03-10 DIAGNOSIS — Z809 Family history of malignant neoplasm, unspecified: Secondary | ICD-10-CM | POA: Insufficient documentation

## 2023-03-10 DIAGNOSIS — Z87442 Personal history of urinary calculi: Secondary | ICD-10-CM | POA: Insufficient documentation

## 2023-03-10 NOTE — Progress Notes (Signed)
Radiation Oncology         [phone]) [phone] ________________________________  Initial Outpatient Consultation  Name: Justin Norris MRN: [id number]  Date: 03/10/2023  DOB: 1971/03/15  WU:[id number], [name], DO  [name], DO   REFERRING PHYSICIAN: [name], DO  DIAGNOSIS: 52 y.o. 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    ICD-10-CM   1. Prostate cancer (HCC); PSA 7.48; GG 1,2; unfavorable intermediate risk (>50% cores +)  C61       HISTORY OF PRESENT ILLNESS: Justin Norris is a 52 y.o. male with a diagnosis of prostate cancer. He was noted to have an elevated PSA of 6.76 on routine labs with his primary care physician, Dr. [name] in 08/2022. A repeat PSA in 11/2022 remained elevated at 7.48 so, he was referred for evaluation in urology by Dr. [name] on 12/23/22,  digital rectal examination performed at that time showed no nodules or concerning findings.  His father was diagnosed with prostate cancer in his 62s and treated with brachytherapy.  The patient proceeded to transrectal ultrasound with 12 biopsies of the prostate on 01/13/23.  The prostate volume measured 27 cc.  Out of 12 core biopsies, 8 were positive.  The maximum Gleason score was 3+4, and this was seen in the right apex, right apex lateral and right mid.  Additionally, Gleason 3+3 was seen in the left base, left base lateral, left mid, right base and right base lateral.  A PSMA PET scan was performed on 02/11/2023 for disease staging and did not show any evidence of metastatic disease.  There were incidental findings of mild hepatic steatosis and aortic atherosclerosis.   The patient reviewed the biopsy and imaging results with his urologist and he has kindly been referred today for discussion of potential radiation treatment options.  A decipher test was also ordered but results are not back at this time.   PREVIOUS RADIATION THERAPY: No  PAST MEDICAL HISTORY:   Past Medical History:  Diagnosis Date   ANXIETY 07/15/2007   BACK PAIN, RIGHT 07/10/2010   Headache(784.0) 07/15/2007   NEPHROLITHIASIS, HX OF 07/15/2007      PAST SURGICAL HISTORY: Past Surgical History:  Procedure Laterality Date   HERNIA REPAIR     LIH   PROSTATE BIOPSY      FAMILY HISTORY:  Family History  Problem Relation Age of Onset   Heart disease Father    Skin cancer Father     SOCIAL HISTORY: He is currently raising 4, 61, 28 and 32 year old children Social History   Socioeconomic History   Marital status: Married    Spouse name: Not on file   Number of children: 9   Years of education: Not on file   Highest education level: Bachelor's degree (e.g., BA, AB, BS)  Occupational History   Occupation: Environmental health practitioner: [employer]  Tobacco Use   Smoking status: Former    Current packs/day: 0.00    Types: Cigarettes    Start date: 07/03/1994    Quit date: 07/03/2014    Years since quitting: 8.6    Passive exposure: Never   Smokeless tobacco: Never  Vaping Use   Vaping status: Never Used  Substance and Sexual Activity   Alcohol use: No    Alcohol/week: 0.0 standard drinks of alcohol    Comment: rare   Drug use: No   Sexual activity: Yes    Partners: Female  Other Topics Concern   Not on file  Social History Narrative   Not on file   Social Determinants of Health   Financial Resource Strain: Low Risk  (12/07/2022)   Overall Financial Resource Strain (CARDIA)    Difficulty of Paying Living Expenses: Not hard at all  Food Insecurity: No Food Insecurity (12/07/2022)   Hunger Vital Sign    Worried About Running Out of Food in the Last Year: Never true    Ran Out of Food in the Last Year: Never true  Transportation Needs: No Transportation Needs (12/07/2022)   PRAPARE - Administrator, Civil Service (Medical): No    Lack of Transportation (Non-Medical): No  Physical Activity: Sufficiently Active (12/07/2022)   Exercise  Vital Sign    Days of Exercise per Week: 5 days    Minutes of Exercise per Session: 90 min  Stress: Stress Concern Present (12/07/2022)   [employer] of Occupational Health - Occupational Stress Questionnaire    Feeling of Stress : To some extent  Social Connections: Socially Integrated (12/07/2022)   Social Connection and Isolation Panel [NHANES]    Frequency of Communication with Friends and Family: More than three times a week    Frequency of Social Gatherings with Friends and Family: Once a week    Attends Religious Services: More than 4 times per year    Active Member of [employer] or Organizations: Yes    Attends Engineer, structural: More than 4 times per year    Marital Status: Married  Catering manager Violence: Unknown (10/20/2021)   Received from [employer], [facility]   HITS    Physically Hurt: Not on file    Insult or Talk Down To: Not on file    Threaten Physical Harm: Not on file    Scream or Curse: Not on file    ALLERGIES: Coconut fatty acids  MEDICATIONS:  Current Outpatient Medications  Medication Sig Dispense Refill   ALPRAZolam (XANAX) 0.5 MG tablet Take 1 tablet (0.5 mg total) by mouth 2 (two) times daily as needed. 45 tablet 3   moxifloxacin (VIGAMOX) 0.5 % ophthalmic solution Place 1 drop into both eyes 3 (three) times daily. 3 mL 0   podofilox (CONDYLOX) 0.5 % external solution Apply topically every 12 hours in the morning and evening for 3 days, then withhold for 4 days; repeat cycle up to 4 times 3.5 mL 0   SYRINGE-NEEDLE, DISP, 3 ML 23G X 1" 3 ML MISC Use to inject testosterone. (Patient not taking: Reported on 03/10/2023) 50 each 1   tadalafil (CIALIS) 20 MG tablet Take 1 tablet (20 mg total) by mouth daily as needed for erectile dysfunction. 30 tablet 3   testosterone cypionate (DEPOTESTOSTERONE CYPIONATE) 200 MG/ML injection Inject 1 mL (200 mg total) into the muscle every 14 (fourteen) days. (Patient not taking: Reported on 03/10/2023) 10 mL  0   No current facility-administered medications for this visit.    REVIEW OF SYSTEMS:  On review of systems, the patient reports that he is doing well overall. He denies any chest pain, shortness of breath, cough, fevers, chills, night sweats, unintended weight changes. He denies any bowel disturbances, and denies abdominal pain, nausea or vomiting. He denies any new musculoskeletal or joint aches or pains. His IPSS was 14, indicating moderate urinary symptoms since the time of his biopsy but gradually improving. His SHIM was 25, indicating he does not have erectile dysfunction. A complete review of systems is obtained and  is otherwise negative.    PHYSICAL EXAM:  Wt Readings from Last 3 Encounters:  03/10/23 211 lb (95.7 kg)  01/20/23 206 lb (93.4 kg)  01/13/23 208 lb (94.3 kg)   Temp Readings from Last 3 Encounters:  12/22/21 98.4 F (36.9 C) (Oral)  01/12/18 98.9 F (37.2 C) (Oral)  07/26/17 98.1 F (36.7 C) (Oral)   BP Readings from Last 3 Encounters:  03/10/23 123/78  01/20/23 (!) 146/90  01/13/23 129/88   Pulse Readings from Last 3 Encounters:  03/10/23 69  01/20/23 67  01/13/23 64    /10  In general this is a well appearing Caucasian man in no acute distress. He's alert and oriented x4 and appropriate throughout the examination. Cardiopulmonary assessment is negative for acute distress, and he exhibits normal effort.     KPS = 100  100 - Normal; no complaints; no evidence of disease. 90   - Able to carry on normal activity; minor signs or symptoms of disease. 80   - Normal activity with effort; some signs or symptoms of disease. 38   - Cares for self; unable to carry on normal activity or to do active work. 60   - Requires occasional assistance, but is able to care for most of his personal needs. 50   - Requires considerable assistance and frequent medical care. 40   - Disabled; requires special care and assistance. 30   - Severely disabled; hospital admission is  indicated although death not imminent. 20   - Very sick; hospital admission necessary; active supportive treatment necessary. 10   - Moribund; fatal processes progressing rapidly. 0     - Dead  Karnofsky DA, Abelmann WH, Craver LS and Burchenal JH [phone]) The use of the nitrogen mustards in the palliative treatment of carcinoma: with particular reference to bronchogenic carcinoma Cancer 1 634-56  LABORATORY DATA:  Lab Results  Component Value Date   WBC 8.3 09/07/2022   HGB 16.9 09/07/2022   HCT 51.0 (H) 09/07/2022   MCV 81.5 09/07/2022   PLT 341 09/07/2022   Lab Results  Component Value Date   NA 144 03/13/2022   K 4.7 03/13/2022   CL 105 03/13/2022   CO2 28 03/13/2022   Lab Results  Component Value Date   ALT 25 03/13/2022   AST 18 03/13/2022   ALKPHOS 76 08/10/2016   BILITOT 0.7 03/13/2022     RADIOGRAPHY: NM PET (PSMA) SKULL TO MID THIGH  Result Date: 02/22/2023 CLINICAL DATA:  Staging of high-risk prostate cancer.  PSA of 7.5 EXAM: NUCLEAR MEDICINE PET SKULL BASE TO THIGH TECHNIQUE: 9.3 mCi F18 Piflufolastat (Pylarify) was injected intravenously. Full-ring PET imaging was performed from the skull base to thigh after the radiotracer. CT data was obtained and used for attenuation correction and anatomic localization. COMPARISON:  01/14/2011 abdominopelvic CT. FINDINGS: NECK No radiotracer activity in neck lymph nodes. Incidental CT finding: Left maxillary sinus mucous retention cyst or polyp. No cervical adenopathy. CHEST No radiotracer accumulation within mediastinal or hilar lymph nodes. No suspicious pulmonary nodules on the CT scan. Incidental CT finding: No thoracic adenopathy. ABDOMEN/PELVIS Prostate: Low-level tracer affinity within the prostate. Example diffusely at the base to midgland level at a S.U.V. max of 3.5. Eccentric right apical posterior gland at a S.U.V. max of 3.8. Lymph nodes: No abnormal radiotracer accumulation within pelvic or abdominal nodes. Liver: No  evidence of liver metastasis. Incidental CT finding: Mild hepatic steatosis. Normal adrenal glands. Abdominal aortic atherosclerosis. Symmetric seminal vesicles. SKELETON No  focal activity to suggest skeletal metastasis. Left iliac sclerotic lesion is present in 2012 and can be presumed a bone island. IMPRESSION: 1. Low-level nonspecific prostatic tracer uptake may represent site or sites of primary. 2. No evidence of tracer avid nodal, distant, or osseous metastasis. 3. Incidental findings, including: Mild hepatic steatosis. Aortic Atherosclerosis (ICD10-I70.0). Sinus disease. Electronically Signed   By: Jeronimo Greaves M.D.   On: 02/22/2023 10:03      IMPRESSION/PLAN: 1. 52 y.o. gentleman with Stage T1c adenocarcinoma of the prostate with Gleason Score of 3+4, and PSA of 7.48. We discussed the patient's workup and outlined the nature of prostate cancer in this setting. The patient's T stage, Gleason's score, and PSA put him into the favorable intermediate risk group but based on the number of biopsy cores positive, he is technically unfavorable intermediate risk. Accordingly, he is eligible for a variety of potential treatment options including brachytherapy, 5.5 weeks of external radiation or prostatectomy. We discussed the available radiation techniques, and focused on the details and logistics of delivery. We discussed and outlined the risks, benefits, short and long-term effects associated with radiotherapy and compared and contrasted these with prostatectomy. We discussed the role of SpaceOAR gel in reducing the rectal toxicity associated with radiotherapy.  He appears to have a good understanding of his disease and our treatment recommendations which are of curative intent.  He was encouraged to ask questions that were answered to his stated satisfaction.  At the conclusion of our conversation, the patient is interested in moving forward with brachytherapy and use of SpaceOAR gel to reduce rectal toxicity  from radiotherapy.  We will share our discussion with Dr. [name] and move forward with scheduling his CT [facility] planning appointment in the near future.  The patient will be contacted by Darryl Nestle in our office who will be working closely with him to coordinate OR scheduling and pre and post procedure appointments.  We will contact the pharmaceutical rep to ensure that SpaceOAR is available at the time of procedure.  We enjoyed meeting him today and look forward to continuing to participate in his care.  We personally spent 70 minutes in this encounter including chart review, reviewing radiological studies, meeting face-to-face with the patient, entering orders and completing documentation.    Marguarite Arbour, PA-C    Margaretmary Dys, MD  [facility]  Radiation Oncology Direct Dial: [phone]  Fax: [phone] [email or web address]  Skype  LinkedIn

## 2023-03-10 NOTE — Telephone Encounter (Signed)
Decipher results needs to be faxed to [facility] @  [phone]  Call back # is [phone]

## 2023-03-10 NOTE — Assessment & Plan Note (Signed)
Currently seeing urology and has upcoming consultation with radiation oncology to discuss brachytherapy.  He has hesitant to have radical prostatectomy.  He is concerned about his PSA levels have this rechecked to make sure they are not continuing to rise at this time.  Additionally, he would like to know if his testosterone levels have returned to his baseline.  Regardless of whether his levels have returned or not I advised him that he should remain off of testosterone indefinitely at this time.

## 2023-03-10 NOTE — Telephone Encounter (Signed)
Left msg at Radiation Onc that the results were not available yet and that when they were we would send them over.

## 2023-03-10 NOTE — Progress Notes (Signed)
TALOR CONWILL - 52 y.o. male MRN [id number]  Date of birth: [birth year]  Subjective Chief Complaint  Patient presents with   testosterone follow up    HPI DELFORD LIECHTY is a 52 y.o. male here today for follow up.   He is started on testosterone therapy previously and noted to have elevated PSA at follow-up appointment.  Recheck of PSA indicated that this had increased further may.  Testosterone therapy discontinued and he was referred to urology.  Urology completed biopsies which returned positive for prostate cancer.  He did undergo PET scan recently which did not show any metastasis.  He was told between brachytherapy versus radical prostatectomy.  Additional testing has been ordered by his urologist as well.  He does meet with radiation oncology later today.  He admits that he is scared regarding the potential outcome and prognosis of his prostate cancer.  He would like to have his PSA level rechecked today to be sure it is not continuing to rise.  He is also concerned about his testosterone levels.  ROS:  A comprehensive ROS was completed and negative except as noted per HPI  Allergies  Allergen Reactions   Coconut Fatty Acids Anaphylaxis    Past Medical History:  Diagnosis Date   ANXIETY 07/15/2007   BACK PAIN, RIGHT 07/10/2010   Headache(784.0) 07/15/2007   NEPHROLITHIASIS, HX OF 07/15/2007    Past Surgical History:  Procedure Laterality Date   HERNIA REPAIR     LIH   PROSTATE BIOPSY      Social History   Socioeconomic History   Marital status: Married    Spouse name: Not on file   Number of children: 9   Years of education: Not on file   Highest education level: Bachelor's degree (e.g., BA, AB, BS)  Occupational History   Occupation: Environmental health practitioner: [employer]  Tobacco Use   Smoking status: Former    Current packs/day: 0.00    Types: Cigarettes    Start date: 07/03/1994    Quit date: 07/03/2014    Years since quitting: 8.6    Passive  exposure: Never   Smokeless tobacco: Never  Vaping Use   Vaping status: Never Used  Substance and Sexual Activity   Alcohol use: No    Alcohol/week: 0.0 standard drinks of alcohol    Comment: rare   Drug use: No   Sexual activity: Yes    Partners: Female  Other Topics Concern   Not on file  Social History Narrative   Not on file   Social Determinants of Health   Financial Resource Strain: Low Risk  (12/07/2022)   Overall Financial Resource Strain (CARDIA)    Difficulty of Paying Living Expenses: Not hard at all  Food Insecurity: No Food Insecurity (03/10/2023)   Hunger Vital Sign    Worried About Running Out of Food in the Last Year: Never true    Ran Out of Food in the Last Year: Never true  Transportation Needs: No Transportation Needs (03/10/2023)   PRAPARE - Administrator, Civil Service (Medical): No    Lack of Transportation (Non-Medical): No  Physical Activity: Sufficiently Active (12/07/2022)   Exercise Vital Sign    Days of Exercise per Week: 5 days    Minutes of Exercise per Session: 90 min  Stress: Stress Concern Present (12/07/2022)   [employer] of Occupational Health - Occupational Stress Questionnaire    Feeling of Stress : To some extent  Social  Connections: Socially Integrated (12/07/2022)   Social Connection and Isolation Panel [NHANES]    Frequency of Communication with Friends and Family: More than three times a week    Frequency of Social Gatherings with Friends and Family: Once a week    Attends Religious Services: More than 4 times per year    Active Member of Clubs or Organizations: Yes    Attends Engineer, structural: More than 4 times per year    Marital Status: Married    Family History  Problem Relation Age of Onset   Heart disease Father    Skin cancer Father     Health Maintenance  Topic Date Due   Colonoscopy  03/14/2023 (Originally 06/02/2014)   Hepatitis C Screening  03/14/2023 (Originally 06/03/1987)   HIV  Screening  03/14/2023 (Originally 06/02/1984)   COVID-19 Vaccine (6 - 2023-24 season) 03/26/2023 (Originally 06/06/2022)   Zoster Vaccines- Shingrix (2 of 2) 06/10/2023 (Originally 02/01/2023)   INFLUENZA VACCINE  10/18/2023 (Originally 02/18/2023)   DTaP/Tdap/Td (2 - Td or Tdap) 12/06/2032   HPV VACCINES  Aged Out     ----------------------------------------------------------------------------------------------------------------------------------------------------------------------------------------------------------------- Physical Exam BP 123/78   Pulse 69   Ht 5\' 11"  (1.803 m)   Wt 211 lb (95.7 kg)   SpO2 97%   BMI 29.43 kg/m   Physical Exam Constitutional:      Appearance: Normal appearance.  Eyes:     General: No scleral icterus. Cardiovascular:     Rate and Rhythm: Normal rate and regular rhythm.  Pulmonary:     Effort: Pulmonary effort is normal.     Breath sounds: Normal breath sounds.  Musculoskeletal:     Cervical back: Neck supple.  Neurological:     General: No focal deficit present.     Mental Status: He is alert.  Psychiatric:        Mood and Affect: Mood normal.        Behavior: Behavior normal.     ------------------------------------------------------------------------------------------------------------------------------------------------------------------------------------------------------------------- Assessment and Plan  Prostate cancer (HCC); PSA 7.48; GG 1,2; unfavorable intermediate risk (>50% cores +) Currently seeing urology and has upcoming consultation with radiation oncology to discuss brachytherapy.  He has hesitant to have radical prostatectomy.  He is concerned about his PSA levels have this rechecked to make sure they are not continuing to rise at this time.  Additionally, he would like to know if his testosterone levels have returned to his baseline.  Regardless of whether his levels have returned or not I advised him that he should remain  off of testosterone indefinitely at this time.   No orders of the defined types were placed in this encounter.   No follow-ups on file.  Time:  45 minutes spent including pre visit preparation, review of prior notes and labs, encounter with patient and same day documentation.   This visit occurred during the SARS-CoV-2 public health emergency.  Safety protocols were in place, including screening questions prior to the visit, additional usage of staff PPE, and extensive cleaning of exam room while observing appropriate contact time as indicated for disinfecting solutions.

## 2023-03-11 LAB — PSA, TOTAL AND FREE
PSA, Free Pct: 6.6 %
PSA, Free: 0.43 ng/mL
Prostate Specific Ag, Serum: 6.5 ng/mL — ABNORMAL HIGH (ref 0.0–4.0)

## 2023-03-11 LAB — TESTOSTERONE: Testosterone: 235 ng/dL — ABNORMAL LOW (ref 264–916)

## 2023-03-15 ENCOUNTER — Telehealth: Payer: Self-pay | Admitting: Urology

## 2023-03-15 NOTE — Telephone Encounter (Signed)
Shirly from Dr Kathrynn Running office - book brachytherapy  Call back # [phone]

## 2023-03-15 NOTE — Progress Notes (Signed)
RN left message with patient for call back.

## 2023-03-18 ENCOUNTER — Other Ambulatory Visit: Payer: Self-pay | Admitting: Urology

## 2023-03-18 ENCOUNTER — Ambulatory Visit: Payer: Self-pay | Admitting: Urology

## 2023-03-18 DIAGNOSIS — C61 Malignant neoplasm of prostate: Secondary | ICD-10-CM

## 2023-03-23 ENCOUNTER — Encounter: Payer: Self-pay | Admitting: Urology

## 2023-03-24 ENCOUNTER — Encounter: Payer: Self-pay | Admitting: Urology

## 2023-03-31 ENCOUNTER — Other Ambulatory Visit: Payer: Self-pay | Admitting: Family Medicine

## 2023-04-01 ENCOUNTER — Encounter: Payer: Self-pay | Admitting: Family Medicine

## 2023-04-01 ENCOUNTER — Ambulatory Visit: Payer: Managed Care, Other (non HMO) | Admitting: Family Medicine

## 2023-04-01 VITALS — BP 152/93 | HR 90 | Ht 71.0 in | Wt 200.0 lb

## 2023-04-01 DIAGNOSIS — F411 Generalized anxiety disorder: Secondary | ICD-10-CM | POA: Diagnosis not present

## 2023-04-01 DIAGNOSIS — C61 Malignant neoplasm of prostate: Secondary | ICD-10-CM

## 2023-04-01 NOTE — Assessment & Plan Note (Signed)
He has continued anxiety related to his recent cancer diagnosis as well as continued tension with his mother-in-law and sister-in-law.  He may continue alprazolam as needed.  Offered referral to counseling which she declines at this time.  Suggested possibility of marriage counseling as well due to him feeling unsupported by his wife.

## 2023-04-01 NOTE — Assessment & Plan Note (Signed)
Planning on brachytherapy for treatment.  He does have upcoming visit with urology as well as CT mapping with radiation oncology.

## 2023-04-01 NOTE — Progress Notes (Signed)
Justin Norris - 52 y.o. male MRN [id number]  Date of birth: [birth year]  Subjective Chief Complaint  Patient presents with   Medical Management of Chronic Issues    Fup on prostate cancer check    HPI Justin Norris is a 52 y.o. male here today for follow up.   Recent dx of prostate cancer.  Met with radiation oncology after last appointment and has decided to brachytherapy for treatment of his cancer.  His Decipher results also returned showing that his prostate cancer is low risk.  He denies symptoms related to his prostate cancer.  Today he feels optimistic about his treatment.  He does remain little scared regarding his future.  He continues to deal with stressors related to home.  He reports that he is trying to avoid having his children spend time with his wife's sister and mother due to them trying to indoctrinate them into beliefs that are against his values.  Additionally, he does not feel supported by his spouse due to ongoing tension between her family and him..  He has prescription for alprazolam as needed.    ROS:  A comprehensive ROS was completed and negative except as noted per HPI  Allergies  Allergen Reactions   Coconut Fatty Acid Anaphylaxis    Past Medical History:  Diagnosis Date   ANXIETY 07/15/2007   BACK PAIN, RIGHT 07/10/2010   Headache(784.0) 07/15/2007   NEPHROLITHIASIS, HX OF 07/15/2007    Past Surgical History:  Procedure Laterality Date   HERNIA REPAIR     LIH   PROSTATE BIOPSY      Social History   Socioeconomic History   Marital status: Married    Spouse name: Not on file   Number of children: 9   Years of education: Not on file   Highest education level: Bachelor's degree (e.g., BA, AB, BS)  Occupational History   Occupation: Environmental health practitioner: [employer]  Tobacco Use   Smoking status: Former    Current packs/day: 0.00    Types: Cigarettes    Start date: 07/03/1994    Quit date: 07/03/2014    Years since quitting:  8.7    Passive exposure: Never   Smokeless tobacco: Never  Vaping Use   Vaping status: Never Used  Substance and Sexual Activity   Alcohol use: No    Alcohol/week: 0.0 standard drinks of alcohol    Comment: rare   Drug use: No   Sexual activity: Yes    Partners: Female  Other Topics Concern   Not on file  Social History Narrative   Not on file   Social Determinants of Health   Financial Resource Strain: Low Risk  (12/07/2022)   Overall Financial Resource Strain (CARDIA)    Difficulty of Paying Living Expenses: Not hard at all  Food Insecurity: No Food Insecurity (03/10/2023)   Hunger Vital Sign    Worried About Running Out of Food in the Last Year: Never true    Ran Out of Food in the Last Year: Never true  Transportation Needs: No Transportation Needs (03/10/2023)   PRAPARE - Administrator, Civil Service (Medical): No    Lack of Transportation (Non-Medical): No  Physical Activity: Sufficiently Active (12/07/2022)   Exercise Vital Sign    Days of Exercise per Week: 5 days    Minutes of Exercise per Session: 90 min  Stress: Stress Concern Present (12/07/2022)   [employer] of Occupational Health - Occupational Stress Questionnaire  Feeling of Stress : To some extent  Social Connections: Socially Integrated (12/07/2022)   Social Connection and Isolation Panel [NHANES]    Frequency of Communication with Friends and Family: More than three times a week    Frequency of Social Gatherings with Friends and Family: Once a week    Attends Religious Services: More than 4 times per year    Active Member of Clubs or Organizations: Yes    Attends Engineer, structural: More than 4 times per year    Marital Status: Married    Family History  Problem Relation Age of Onset   Heart disease Father    Skin cancer Father     Health Maintenance  Topic Date Due   HIV Screening  Never done   Hepatitis C Screening  Never done   Colonoscopy  Never done   COVID-19  Vaccine (6 - 2023-24 season) 03/21/2023   Zoster Vaccines- Shingrix (2 of 2) 06/10/2023 (Originally 02/01/2023)   INFLUENZA VACCINE  10/18/2023 (Originally 02/18/2023)   DTaP/Tdap/Td (2 - Td or Tdap) 12/06/2032   HPV VACCINES  Aged Out     ----------------------------------------------------------------------------------------------------------------------------------------------------------------------------------------------------------------- Physical Exam BP (!) 152/93   Pulse 90   Ht 5\' 11"  (1.803 m)   Wt 200 lb (90.7 kg)   SpO2 99%   BMI 27.89 kg/m   Physical Exam Constitutional:      Appearance: Normal appearance.  Eyes:     General: No scleral icterus. Musculoskeletal:     Cervical back: Neck supple.  Neurological:     Mental Status: He is alert.  Psychiatric:        Mood and Affect: Mood normal.        Behavior: Behavior normal.     ------------------------------------------------------------------------------------------------------------------------------------------------------------------------------------------------------------------- Assessment and Plan  Prostate cancer (HCC); PSA 7.48; GG 1,2; unfavorable intermediate risk (>50% cores +) Planning on brachytherapy for treatment.  He does have upcoming visit with urology as well as CT mapping with radiation oncology.    Anxiety state He has continued anxiety related to his recent cancer diagnosis as well as continued tension with his mother-in-law and sister-in-law.  He may continue alprazolam as needed.  Offered referral to counseling which she declines at this time.  Suggested possibility of marriage counseling as well due to him feeling unsupported by his wife.   No orders of the defined types were placed in this encounter.   Return in about 6 weeks (around 05/13/2023) for F/u prostate/BP.  Time statement: 45 minutes spent including pre visit preparation, review of prior notes and labs, encounter with  patient and same day documentation.    This visit occurred during the SARS-CoV-2 public health emergency.  Safety protocols were in place, including screening questions prior to the visit, additional usage of staff PPE, and extensive cleaning of exam room while observing appropriate contact time as indicated for disinfecting solutions.

## 2023-04-08 ENCOUNTER — Ambulatory Visit (INDEPENDENT_AMBULATORY_CARE_PROVIDER_SITE_OTHER): Payer: Managed Care, Other (non HMO)

## 2023-04-08 VITALS — BP 130/89 | HR 75 | Ht 71.0 in

## 2023-04-08 DIAGNOSIS — I10 Essential (primary) hypertension: Secondary | ICD-10-CM | POA: Insufficient documentation

## 2023-04-08 DIAGNOSIS — R030 Elevated blood-pressure reading, without diagnosis of hypertension: Secondary | ICD-10-CM | POA: Insufficient documentation

## 2023-04-08 NOTE — Patient Instructions (Signed)
Keep upcoming appt with Dr. [name] scheduled for 05/13/2023.

## 2023-04-08 NOTE — Progress Notes (Signed)
Established Patient Office Visit  Subjective   Patient ID: Justin Norris, male    DOB: 02-06-1971  Age: 52 y.o. MRN: [id number]  Chief Complaint  Patient presents with   Blood Pressure Check    BP check nurse visit.     HPI  BP check - nurse visit. Patient denies chest pain , shortness of breath, dizziness or palpitations. Patient did mention that his HR has been elevated due to anxiety.   ROS    Objective:     BP 130/89   Pulse 75   Ht 5\' 11"  (1.803 m)   SpO2 99%   BMI 27.89 kg/m    Physical Exam   No results found for any visits on 04/08/23.    The ASCVD Risk score (Arnett DK, et al., 2019) failed to calculate for the following reasons:   Cannot find a previous HDL lab   Cannot find a previous total cholesterol lab    Assessment & Plan:  BP check nures visit . Initial reading = 141/95. Second reading = 130/89. Per Dr. [name] no changes to patient medications.  Patient will keep upcoming appt scheduled with Dr. [name] for 05/13/2023. Problem List Items Addressed This Visit       Other   Elevated blood-pressure reading without diagnosis of hypertension - Primary    Return for keep upcoming appt schld for 05/13/2023.Elizabeth Palau, LPN

## 2023-05-05 ENCOUNTER — Telehealth: Payer: Self-pay | Admitting: *Deleted

## 2023-05-05 NOTE — Progress Notes (Signed)
Radiation Oncology         [phone]) [phone] ________________________________  Outpatient Follow up- Pre-seed visit  Name: Justin Norris MRN: [id number]  Date: 05/06/2023  DOB: 1970-09-09  NF:[id number], Justin Batten, DO  Justin Coombe, DO   REFERRING PHYSICIAN: [name], DO  DIAGNOSIS: 52 y.o. 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     ICD-10-CM   1. Prostate cancer (HCC); PSA 7.48; GG 1,2; unfavorable intermediate risk (>50% cores +)  C61       HISTORY OF PRESENT ILLNESS: Justin Norris is a 52 y.o. male with a diagnosis of prostate cancer. He was noted to have an elevated PSA of 6.76 on routine labs with his primary care physician, Dr. [name] in 08/2022. A repeat PSA in 11/2022 remained elevated at 7.48 so, he was referred for evaluation in urology by Dr. [name] on 12/23/22,  digital rectal examination performed at that time showed no nodules or concerning findings.  His father was diagnosed with prostate cancer in his 17s and treated with brachytherapy.  The patient proceeded to transrectal ultrasound with 12 biopsies of the prostate on 01/13/23.  The prostate volume measured 27 cc.  Out of 12 core biopsies, 8 were positive.  The maximum Gleason score was 3+4, and this was seen in the right apex, right apex lateral and right mid.  Additionally, Gleason 3+3 was seen in the left base, left base lateral, left mid, right base and right base lateral.   A PSMA PET scan was performed on 02/11/2023 for disease staging and did not show any evidence of metastatic disease.  There were incidental findings of mild hepatic steatosis and aortic atherosclerosis.   The patient reviewed the biopsy results with his urologist and was kindly referred to [place] for discussion of potential radiation treatment options. We initially met the patient on 03/10/23 and he was most interested in proceeding with brachytherapy and SpaceOAR gel placement for treatment of his  disease. He is here today for his pre-procedure imaging for planning and to answer any additional questions he may have about this treatment.   PREVIOUS RADIATION THERAPY: No  PAST MEDICAL HISTORY:  Past Medical History:  Diagnosis Date   ANXIETY 07/15/2007   BACK PAIN, RIGHT 07/10/2010   Headache(784.0) 07/15/2007   NEPHROLITHIASIS, HX OF 07/15/2007      PAST SURGICAL HISTORY: Past Surgical History:  Procedure Laterality Date   HERNIA REPAIR     LIH   PROSTATE BIOPSY      FAMILY HISTORY:  Family History  Problem Relation Age of Onset   Heart disease Father    Skin cancer Father     SOCIAL HISTORY:  Social History   Socioeconomic History   Marital status: Married    Spouse name: Not on file   Number of children: 9   Years of education: Not on file   Highest education level: Bachelor's degree (e.g., BA, AB, BS)  Occupational History   Occupation: Environmental health practitioner: [employer]  Tobacco Use   Smoking status: Former    Current packs/day: 0.00    Types: Cigarettes    Start date: 07/03/1994    Quit date: 07/03/2014    Years since quitting: 8.8    Passive exposure: Never   Smokeless tobacco: Never  Vaping Use   Vaping status: Never Used  Substance and Sexual Activity   Alcohol use: No    Alcohol/week: 0.0 standard drinks of  alcohol    Comment: rare   Drug use: No   Sexual activity: Yes    Partners: Female  Other Topics Concern   Not on file  Social History Narrative   Not on file   Social Determinants of Health   Financial Resource Strain: Low Risk  (12/07/2022)   Overall Financial Resource Strain (CARDIA)    Difficulty of Paying Living Expenses: Not hard at all  Food Insecurity: No Food Insecurity (03/10/2023)   Hunger Vital Sign    Worried About Running Out of Food in the Last Year: Never true    Ran Out of Food in the Last Year: Never true  Transportation Needs: No Transportation Needs (03/10/2023)   PRAPARE - Doctor, general practice (Medical): No    Lack of Transportation (Non-Medical): No  Physical Activity: Sufficiently Active (12/07/2022)   Exercise Vital Sign    Days of Exercise per Week: 5 days    Minutes of Exercise per Session: 90 min  Stress: Stress Concern Present (12/07/2022)   [employer] of Occupational Health - Occupational Stress Questionnaire    Feeling of Stress : To some extent  Social Connections: Socially Integrated (12/07/2022)   Social Connection and Isolation Panel [NHANES]    Frequency of Communication with Friends and Family: More than three times a week    Frequency of Social Gatherings with Friends and Family: Once a week    Attends Religious Services: More than 4 times per year    Active Member of [employer] or Organizations: Yes    Attends Engineer, structural: More than 4 times per year    Marital Status: Married  Catering manager Violence: Not At Risk (03/10/2023)   Humiliation, Afraid, Rape, and Kick questionnaire    Fear of Current or Ex-Partner: No    Emotionally Abused: No    Physically Abused: No    Sexually Abused: No    ALLERGIES: Coconut fatty acid  MEDICATIONS:  Current Outpatient Medications  Medication Sig Dispense Refill   ALPRAZolam (XANAX) 0.5 MG tablet TAKE 1 TABLET BY MOUTH TWICE A DAY AS NEEDED . RX MUST LAST 30 DAYS 45 tablet 1   podofilox (CONDYLOX) 0.5 % external solution Apply topically every 12 hours in the morning and evening for 3 days, then withhold for 4 days; repeat cycle up to 4 times 3.5 mL 0   tadalafil (CIALIS) 20 MG tablet Take 1 tablet (20 mg total) by mouth daily as needed for erectile dysfunction. 30 tablet 3   No current facility-administered medications for this visit.    REVIEW OF SYSTEMS:  On review of systems, the patient reports that he is doing well overall. He denies any chest pain, shortness of breath, cough, fevers, chills, night sweats, unintended weight changes. He denies any bowel disturbances, and denies  abdominal pain, nausea or vomiting. He denies any new musculoskeletal or joint aches or pains. His IPSS was 14, indicating moderate urinary symptoms since the time of his biopsy but gradually improving. His SHIM was 25, indicating he does not have erectile dysfunction. A complete review of systems is obtained and is otherwise negative.     PHYSICAL EXAM:  Wt Readings from Last 3 Encounters:  04/01/23 200 lb (90.7 kg)  03/10/23 211 lb (95.7 kg)  03/10/23 211 lb (95.7 kg)   Temp Readings from Last 3 Encounters:  03/10/23 97.7 F (36.5 C) (Temporal)  12/22/21 98.4 F (36.9 C) (Oral)  01/12/18 98.9 F (37.2 C) (Oral)  BP Readings from Last 3 Encounters:  04/08/23 130/89  04/01/23 (!) 152/93  03/10/23 (!) 142/98   Pulse Readings from Last 3 Encounters:  04/08/23 75  04/01/23 90  03/10/23 75    /10  In general this is a well appearing Caucasian male in no acute distress. He's alert and oriented x4 and appropriate throughout the examination. Cardiopulmonary assessment is negative for acute distress, and he exhibits normal effort.     KPS = 100  100 - Normal; no complaints; no evidence of disease. 90   - Able to carry on normal activity; minor signs or symptoms of disease. 80   - Normal activity with effort; some signs or symptoms of disease. 62   - Cares for self; unable to carry on normal activity or to do active work. 60   - Requires occasional assistance, but is able to care for most of his personal needs. 50   - Requires considerable assistance and frequent medical care. 40   - Disabled; requires special care and assistance. 30   - Severely disabled; hospital admission is indicated although death not imminent. 20   - Very sick; hospital admission necessary; active supportive treatment necessary. 10   - Moribund; fatal processes progressing rapidly. 0     - Dead  Karnofsky DA, Abelmann WH, Craver LS and Burchenal [facility] [phone]) The use of the nitrogen mustards in the palliative  treatment of carcinoma: with particular reference to bronchogenic carcinoma Cancer 1 634-56  LABORATORY DATA:  Lab Results  Component Value Date   WBC 8.3 09/07/2022   HGB 16.9 09/07/2022   HCT 51.0 (H) 09/07/2022   MCV 81.5 09/07/2022   PLT 341 09/07/2022   Lab Results  Component Value Date   NA 144 03/13/2022   K 4.7 03/13/2022   CL 105 03/13/2022   CO2 28 03/13/2022   Lab Results  Component Value Date   ALT 25 03/13/2022   AST 18 03/13/2022   ALKPHOS 76 08/10/2016   BILITOT 0.7 03/13/2022     RADIOGRAPHY: No results found.    IMPRESSION/PLAN: 1. 52 y.o. 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  The patient has elected to proceed with seed implant for treatment of his disease. We reviewed the risks, benefits, short and long-term effects associated with brachytherapy and discussed the role of SpaceOAR in reducing the rectal toxicity associated with radiotherapy.  He appears to have a good understanding of his disease and our treatment recommendations which are of curative intent.  He was encouraged to ask questions that were answered to his stated satisfaction. He has freely signed written consent to proceed today in the office and a copy of this document will be placed in his medical record. His procedure is tentatively scheduled for 06/01/23 in collaboration with Dr. Margo Aye and we will see him back for his post-procedure visit approximately 3 weeks thereafter. We look forward to continuing to participate in his care. He knows that he is welcome to call with any questions or concerns at any time in the interim.  I personally spent 30 minutes in this encounter including chart review, reviewing radiological studies, meeting face-to-face with the patient, entering orders and completing documentation.    Marguarite Arbour, MMS, PA-C [place]  [facility] Radiation Oncology Physician Assistant Direct  Dial: [phone]  Fax: [phone]

## 2023-05-05 NOTE — Telephone Encounter (Signed)
CALLED PATIENT TO REMIND OF PRE-SEED APPTS. FOR 05-06-23, LVM FOR A RETURN CALL

## 2023-05-05 NOTE — Progress Notes (Signed)
Radiation Oncology         [phone]) [phone] ________________________________  Name: Justin Norris MRN: [id number]  Date: 05/06/2023  DOB: [birth year]  SIMULATION AND TREATMENT PLANNING NOTE PUBIC ARCH STUDY  NF:AOZHYQMV, [name], DO  [name], DO  DIAGNOSIS: 52 y.o. 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   Oncology History  Prostate cancer [facility]); PSA 7.48; GG 1,2; unfavorable intermediate risk (>50% cores +)  01/13/2023 Cancer Staging   Staging form: Prostate, AJCC 8th Edition - Clinical stage from 01/13/2023: Stage IIB (cT1c, cN0, cM0, PSA: 7.5, Grade Group: 2) - Signed by Marcello Fennel, PA-C on 03/10/2023 Histopathologic type: Adenocarcinoma, NOS Stage prefix: Initial diagnosis Prostate specific antigen (PSA) range: Less than 10 Gleason primary pattern: 3 Gleason secondary pattern: 4 Gleason score: 7 Histologic grading system: 5 grade system Number of biopsy cores examined: 12 Number of biopsy cores positive: 8 Location of positive needle core biopsies: Both sides   01/20/2023 Initial Diagnosis   Prostate cancer (HCC); PSA 7.48; GG 1,2; unfavorable intermediate risk (>50% cores +)       ICD-10-CM   1. Prostate cancer (HCC); PSA 7.48; GG 1,2; unfavorable intermediate risk (>50% cores +)  C61       COMPLEX SIMULATION:  The patient presented today for evaluation for possible prostate seed implant. He was brought to the radiation planning suite and placed supine on the CT couch. A 3-dimensional image study set was obtained in upload to the planning computer. There, on each axial slice, I contoured the prostate gland. Then, using three-dimensional radiation planning tools I reconstructed the prostate in view of the structures from the transperineal needle pathway to assess for possible pubic arch interference. In doing so, I did not appreciate any pubic arch interference. Also, the patient's prostate volume was estimated  based on the drawn structure. The volume was 26 cc.  Given the pubic arch appearance and prostate volume, patient remains a good candidate to proceed with prostate seed implant. Today, he freely provided informed written consent to proceed.    PLAN: The patient will undergo prostate seed implant.   ________________________________  Artist Pais. Kathrynn Running, M.D.

## 2023-05-06 ENCOUNTER — Ambulatory Visit
Admission: RE | Admit: 2023-05-06 | Discharge: 2023-05-06 | Disposition: A | Discharge status: Home / Self Care | Payer: Managed Care, Other (non HMO) | Source: Ambulatory Visit | Attending: Urology | Admitting: Urology

## 2023-05-06 ENCOUNTER — Encounter: Payer: Self-pay | Admitting: Urology

## 2023-05-06 ENCOUNTER — Ambulatory Visit
Admission: RE | Admit: 2023-05-06 | Discharge: 2023-05-06 | Disposition: A | Discharge status: Home / Self Care | Payer: Managed Care, Other (non HMO) | Source: Ambulatory Visit | Attending: Radiation Oncology | Admitting: Radiation Oncology

## 2023-05-06 VITALS — Resp 19 | Ht 71.0 in | Wt 202.0 lb

## 2023-05-06 DIAGNOSIS — C61 Malignant neoplasm of prostate: Secondary | ICD-10-CM

## 2023-05-06 NOTE — Progress Notes (Signed)
Pre-seed nursing interview for a diagnosis of Stage T1c adenocarcinoma of the prostate with Gleason score of 3+4, and PSA of 7.48.  Patient identity verified x2.   Patient reports dong well. Patient denies any related issues at this time.  Meaningful use complete.  Urinary Management medication(s)- None Urology appointment date- Pending, with Dr. [name] at [facility]  Resp 19   Ht 5\' 11"  (1.803 m)   Wt 202 lb (91.6 kg)   BMI 28.17 kg/m   This concludes the interaction.  [name], LPN

## 2023-05-13 ENCOUNTER — Ambulatory Visit: Payer: Managed Care, Other (non HMO) | Admitting: Family Medicine

## 2023-05-14 ENCOUNTER — Encounter: Payer: Self-pay | Admitting: Medical-Surgical

## 2023-05-14 ENCOUNTER — Ambulatory Visit (INDEPENDENT_AMBULATORY_CARE_PROVIDER_SITE_OTHER): Payer: Managed Care, Other (non HMO)

## 2023-05-14 ENCOUNTER — Ambulatory Visit: Payer: Managed Care, Other (non HMO) | Admitting: Medical-Surgical

## 2023-05-14 VITALS — BP 127/76 | HR 78 | Resp 20 | Ht 71.0 in | Wt 203.0 lb

## 2023-05-14 DIAGNOSIS — R051 Acute cough: Secondary | ICD-10-CM | POA: Diagnosis not present

## 2023-05-14 DIAGNOSIS — L03113 Cellulitis of right upper limb: Secondary | ICD-10-CM

## 2023-05-14 MED ORDER — METHYLPREDNISOLONE ACETATE 80 MG/ML IJ SUSP
80.0000 mg | Freq: Once | INTRAMUSCULAR | Status: AC
Start: 2023-05-14 — End: 2023-05-14
  Administered 2023-05-14: 80 mg via INTRAMUSCULAR

## 2023-05-14 MED ORDER — AMOXICILLIN-POT CLAVULANATE 875-125 MG PO TABS: 1.0000 | ORAL_TABLET | Freq: Two times a day (BID) | ORAL | 0 refills | Status: DC

## 2023-05-14 NOTE — Progress Notes (Signed)
        Established patient visit  History, exam, impression, and plan:  1. Cellulitis of arm, right Very pleasant 52 year old male presenting today for evaluation of a spot on his right elbow that he noticed on Tuesday evening.  On Wednesday notes that the area got bigger and was becoming painful and tender.  He developed a whitehead in the center and admits that he picked at it.  It oozed some clear fluid then scabbed over.  Since then, the swelling has gotten worse and he notes that the area is very red, painful, itchy, and hot to touch.  He has used Aquaphor on it but no other interventions.  On exam, he does have a centralized oblong black scab of approximately 0.5 cm x 1 cm in size.  The surrounding area is edematous and indurated without fluctuance.  The erythema is circumscribed approximately 2.5-3 cm.  No discharge from the area.  He has 2 open shallow lesions to the right AC that are very small with dry yellow wound beds but these do not appear to be infected.  Unclear if this was an insect bite however plan to treat for cellulitis.  Originally planned for doxycycline to cover for MRSA however with his additional symptoms noted below, plan to treat with Augmentin twice daily x 10 days.  If no improvement over the weekend, may need to add doxycycline as well.  Discussed recommendations for site care.  Okay to cover with a Band-Aid if desired or leave open as long as the area is kept clean.  Discussed warm compresses hourly to help with swelling, pain, itching, and resolution.  2. 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  He has also been affected by this and has had sinus congestion, cough, and chest congestion.  Not sure if he has had a fever or chills and is not currently treating his symptoms.  On exam today, he has a very strong wet cough.  Rhonchi/rales and scattered wheezing noted throughout  all lung fields, worse in the right lower lobe. Getting CXR today. Plan to treat with Augmentin as noted above. Depo-Medrol 80mg  IM x 1 in office. Declined oral steroids today.  - DG Chest 2 View; Future - methylPREDNISolone acetate (DEPO-MEDROL) injection 80 mg   Procedures performed this visit: None.  Return if symptoms worsen or fail to improve.  __________________________________ Thayer Ohm, DNP, APRN, FNP-BC Primary Care and Sports Medicine [facility] [place] [facility]

## 2023-05-18 ENCOUNTER — Ambulatory Visit: Payer: Managed Care, Other (non HMO) | Admitting: Family Medicine

## 2023-05-18 ENCOUNTER — Encounter: Payer: Self-pay | Admitting: Family Medicine

## 2023-05-18 VITALS — BP 104/67 | HR 62 | Ht 71.0 in | Wt 205.0 lb

## 2023-05-18 DIAGNOSIS — L03113 Cellulitis of right upper limb: Secondary | ICD-10-CM | POA: Insufficient documentation

## 2023-05-18 DIAGNOSIS — C61 Malignant neoplasm of prostate: Secondary | ICD-10-CM | POA: Diagnosis not present

## 2023-05-18 MED ORDER — DOXYCYCLINE HYCLATE 100 MG PO TABS: 100.0000 mg | ORAL_TABLET | Freq: Two times a day (BID) | ORAL | 0 refills | Status: DC

## 2023-05-18 MED ORDER — MUPIROCIN 2 % EX OINT: 1.0000 | TOPICAL_OINTMENT | Freq: Two times a day (BID) | CUTANEOUS | 0 refills | Status: DC

## 2023-05-18 NOTE — Assessment & Plan Note (Signed)
Has upcoming radioactive seed procedure.

## 2023-05-18 NOTE — Patient Instructions (Signed)
Stop augmentin Start doxycycline Apply topical mupirocin

## 2023-05-18 NOTE — Progress Notes (Signed)
Justin Norris - 52 y.o. male MRN [id number]  Date of birth: 1971-03-17  Subjective Chief Complaint  Patient presents with   Insect Bite   Hypertension    HPI Justin Norris is a 52 y.o. male here today for follow up.    Seen last week by Christen Butter due to inflammation around R elbow.  Dx with cellulitis and started on augmentin.  Reports that this is still inflamed but feels a little better.  Still draining some pus.   He has upcoming procedure for prostate cancer.  Will have radioactive seeds placed in a couple of weeks.   BP elevated previously.  Admittedly anxious about prostate cancer diagnosis as well as some family issues at home.   ROS:  A comprehensive ROS was completed and negative except as noted per HPI  Allergies  Allergen Reactions   Coconut Fatty Acid Anaphylaxis    Past Medical History:  Diagnosis Date   ANXIETY 07/15/2007   BACK PAIN, RIGHT 07/10/2010   Headache(784.0) 07/15/2007   NEPHROLITHIASIS, HX OF 07/15/2007    Past Surgical History:  Procedure Laterality Date   HERNIA REPAIR     LIH   PROSTATE BIOPSY      Social History   Socioeconomic History   Marital status: Married    Spouse name: Not on file   Number of children: 9   Years of education: Not on file   Highest education level: Bachelor's degree (e.g., BA, AB, BS)  Occupational History   Occupation: Environmental health practitioner: [employer]  Tobacco Use   Smoking status: Former    Current packs/day: 0.00    Types: Cigarettes    Start date: 07/03/1994    Quit date: 07/03/2014    Years since quitting: 8.8    Passive exposure: Never   Smokeless tobacco: Never  Vaping Use   Vaping status: Never Used  Substance and Sexual Activity   Alcohol use: No    Alcohol/week: 0.0 standard drinks of alcohol    Comment: rare   Drug use: No   Sexual activity: Yes    Partners: Female  Other Topics Concern   Not on file  Social History Narrative   Not on file   Social Determinants of  Health   Financial Resource Strain: Low Risk  (05/17/2023)   Overall Financial Resource Strain (CARDIA)    Difficulty of Paying Living Expenses: Not hard at all  Food Insecurity: No Food Insecurity (05/17/2023)   Hunger Vital Sign    Worried About Running Out of Food in the Last Year: Never true    Ran Out of Food in the Last Year: Never true  Transportation Needs: No Transportation Needs (05/17/2023)   PRAPARE - Administrator, Civil Service (Medical): No    Lack of Transportation (Non-Medical): No  Physical Activity: Sufficiently Active (05/17/2023)   Exercise Vital Sign    Days of Exercise per Week: 5 days    Minutes of Exercise per Session: 60 min  Stress: No Stress Concern Present (05/17/2023)   [employer] of Occupational Health - Occupational Stress Questionnaire    Feeling of Stress : Only a little  Social Connections: Socially Integrated (05/17/2023)   Social Connection and Isolation Panel [NHANES]    Frequency of Communication with Friends and Family: More than three times a week    Frequency of Social Gatherings with Friends and Family: Once a week    Attends Religious Services: More than 4 times per  year    Active Member of Clubs or Organizations: No    Attends Engineer, structural: More than 4 times per year    Marital Status: Married    Family History  Problem Relation Age of Onset   Heart disease Father    Skin cancer Father     Health Maintenance  Topic Date Due   HIV Screening  Never done   Hepatitis C Screening  Never done   Colonoscopy  Never done   COVID-19 Vaccine (6 - 2023-24 season) 05/30/2023 (Originally 03/21/2023)   Zoster Vaccines- Shingrix (2 of 2) 06/10/2023 (Originally 02/01/2023)   INFLUENZA VACCINE  10/18/2023 (Originally 02/18/2023)   DTaP/Tdap/Td (2 - Td or Tdap) 12/06/2032   HPV VACCINES  Aged Out      ----------------------------------------------------------------------------------------------------------------------------------------------------------------------------------------------------------------- Physical Exam BP 104/67 (BP Location: Left Arm, Patient Position: Sitting, Cuff Size: Normal)   Pulse 62   Ht 5\' 11"  (1.803 m)   Wt 205 lb (93 kg)   SpO2 95%   BMI 28.59 kg/m   Physical Exam Constitutional:      Appearance: Normal appearance.  Eyes:     General: No scleral icterus. Cardiovascular:     Rate and Rhythm: Normal rate and regular rhythm.  Pulmonary:     Effort: Pulmonary effort is normal.     Breath sounds: Normal breath sounds.  Musculoskeletal:     Cervical back: Neck supple.  Skin:    Comments: Raised, indurated area on R elbow with necrotic center and purulent material draining.    Neurological:     General: No focal deficit present.     Mental Status: He is alert.     ------------------------------------------------------------------------------------------------------------------------------------------------------------------------------------------------------------------- Assessment and Plan  Prostate cancer (HCC); PSA 7.48; GG 1,2; unfavorable intermediate risk (>50% cores +) Has upcoming radioactive seed procedure.    Cellulitis of arm, right Does not appear to be improving much.  Change augmentin to doxycycline.  Add topical mupirocin.     Meds ordered this encounter  Medications   mupirocin ointment (BACTROBAN) 2 %    Sig: Apply 1 Application topically 2 (two) times daily.    Dispense:  22 g    Refill:  0   doxycycline (VIBRA-TABS) 100 MG tablet    Sig: Take 1 tablet (100 mg total) by mouth 2 (two) times daily.    Dispense:  20 tablet    Refill:  0    Return in about 7 weeks (around 07/06/2023) for Prostate.    This visit occurred during the SARS-CoV-2 public health emergency.  Safety protocols were in place, including  screening questions prior to the visit, additional usage of staff PPE, and extensive cleaning of exam room while observing appropriate contact time as indicated for disinfecting solutions.

## 2023-05-18 NOTE — Assessment & Plan Note (Signed)
Does not appear to be improving much.  Change augmentin to doxycycline.  Add topical mupirocin.

## 2023-05-19 ENCOUNTER — Other Ambulatory Visit: Payer: Self-pay | Admitting: Family Medicine

## 2023-05-25 ENCOUNTER — Ambulatory Visit: Payer: Managed Care, Other (non HMO) | Admitting: Urology

## 2023-05-25 ENCOUNTER — Encounter: Payer: Self-pay | Admitting: Urology

## 2023-05-25 ENCOUNTER — Encounter (HOSPITAL_BASED_OUTPATIENT_CLINIC_OR_DEPARTMENT_OTHER): Payer: Self-pay | Admitting: Urology

## 2023-05-25 ENCOUNTER — Ambulatory Visit (HOSPITAL_BASED_OUTPATIENT_CLINIC_OR_DEPARTMENT_OTHER): Admit: 2023-05-25 | Payer: Managed Care, Other (non HMO) | Admitting: Urology

## 2023-05-25 ENCOUNTER — Encounter (HOSPITAL_BASED_OUTPATIENT_CLINIC_OR_DEPARTMENT_OTHER): Payer: Self-pay

## 2023-05-25 VITALS — BP 150/92 | HR 64 | Ht 71.5 in | Wt 202.0 lb

## 2023-05-25 DIAGNOSIS — C61 Malignant neoplasm of prostate: Secondary | ICD-10-CM | POA: Diagnosis not present

## 2023-05-25 LAB — URINALYSIS, ROUTINE W REFLEX MICROSCOPIC
Bilirubin, UA: NEGATIVE
Glucose, UA: NEGATIVE
Ketones, UA: NEGATIVE
Leukocytes,UA: NEGATIVE
Nitrite, UA: NEGATIVE
Protein,UA: NEGATIVE
RBC, UA: NEGATIVE
Specific Gravity, UA: >1.03 — ABNORMAL HIGH (ref 1.005–1.030)
Urobilinogen, Ur: 0.2 mg/dL (ref 0.2–1.0)
pH, UA: 5.5 (ref 5.0–7.5)

## 2023-05-25 SURGERY — INSERTION, RADIATION SOURCE, PROSTATE
Anesthesia: General | Laterality: Bilateral

## 2023-05-25 MED ORDER — TAMSULOSIN HCL 0.4 MG PO CAPS: 0.4000 mg | ORAL_CAPSULE | Freq: Every day | ORAL | 3 refills | Status: DC

## 2023-05-25 NOTE — Progress Notes (Signed)
Spoke w/ via phone for pre-op interview--- Justin Norris Lab needs dos----  NONE       Lab results------ COVID test -----patient states asymptomatic no test needed Arrive at -------0740 NPO after MN NO Solid Food.  Med rec completed Medications to take morning of surgery ----- Xanax PRN, Flomax and Doxycycline Diabetic medication ----- Patient instructed no nail polish to be worn day of surgery Patient instructed to bring photo id and insurance card day of surgery Patient aware to have Driver (ride ) / caregiver    for 24 hours after surgery - Wife Justin Norris Patient Special Instructions ----- Pre-Op special Instructions ----- Patient verbalized understanding of instructions that were given at this phone interview. Patient denies chest pain, sob, fever, cough at the interview.

## 2023-05-25 NOTE — H&P (View-Only) (Signed)
   Assessment: 1. Prostate cancer [facility])     Plan: Today I had a long discussion with the patient regarding his upcoming surgical procedure including the nature of the procedure itself as well as potential adverse events and complications.  Patient had numerous questions today which I answered. I have recommend that he begin tamsulosin and plan on taking it for the next several months to reduce issues with lower urinary tract symptoms.  He is agreeable to this.    Chief Complaint: Prostate cancer  HPI: Justin Norris is a 52 y.o. male who presents for continued evaluation of prostate cancer. He is here today for preop visit scheduled for brachytherapy plus SpaceOAR next week. Patient has been followed here in the office by Dr. [name] and following radiation oncology consultation he has elected brachy + spaceOart for his unfavorable intermediate risk prostate cancer. Pretreatment PSA = 7.48 Gleason score = 3+4 = 7 Unfavorable intermediate risk due to greater than 50% of cores positive. PSMA-PET negative for extraprostatic disease.  Portions of the above documentation were copied from a prior visit for review purposes only.  Allergies: Allergies  Allergen Reactions   Coconut Fatty Acid Anaphylaxis    PMH: Past Medical History:  Diagnosis Date   ANXIETY 07/15/2007   BACK PAIN, RIGHT 07/10/2010   Headache(784.0) 07/15/2007   NEPHROLITHIASIS, HX OF 07/15/2007    PSH: Past Surgical History:  Procedure Laterality Date   HERNIA REPAIR     LIH   PROSTATE BIOPSY      SH: Social History   Tobacco Use   Smoking status: Former    Current packs/day: 0.00    Types: Cigarettes    Start date: 07/03/1994    Quit date: 07/03/2014    Years since quitting: 8.8    Passive exposure: Never   Smokeless tobacco: Never  Vaping Use   Vaping status: Never Used  Substance Use Topics   Alcohol use: No    Alcohol/week: 0.0 standard drinks of alcohol    Comment: rare   Drug use: No     ROS: Constitutional:  Negative for fever, chills, weight loss CV: Negative for chest pain, previous MI, hypertension Respiratory:  Negative for shortness of breath, wheezing, sleep apnea, frequent cough GI:  Negative for nausea, vomiting, bloody stool, GERD  PE: BP (!) 150/92   Pulse 64   Ht 5' 11.5" (1.816 m)   Wt 202 lb (91.6 kg)   BMI 27.78 kg/m  GENERAL APPEARANCE:  Well appearing, well developed, well nourished, NAD HEENT:  Atraumatic, normocephalic COR:  RR LUNGS:  CTA bilaterally ABDOMEN:  Soft, non-tender, no masses EXTREMITIES:  Moves all extremities well, without clubbing, cyanosis, or edema NEUROLOGIC:  Alert and oriented x 3, normal gait, CN II-XII grossly intact MENTAL STATUS:  appropriate BACK:  Non-tender to palpation, No CVAT SKIN:  Warm, dry, and intact   Results: UA clear

## 2023-05-25 NOTE — Progress Notes (Signed)
   Assessment: 1. Prostate cancer [facility])     Plan: Today I had a long discussion with the patient regarding his upcoming surgical procedure including the nature of the procedure itself as well as potential adverse events and complications.  Patient had numerous questions today which I answered. I have recommend that he begin tamsulosin and plan on taking it for the next several months to reduce issues with lower urinary tract symptoms.  He is agreeable to this.    Chief Complaint: Prostate cancer  HPI: Justin Norris is a 52 y.o. male who presents for continued evaluation of prostate cancer. He is here today for preop visit scheduled for brachytherapy plus SpaceOAR next week. Patient has been followed here in the office by Dr. [name] and following radiation oncology consultation he has elected brachy + spaceOart for his unfavorable intermediate risk prostate cancer. Pretreatment PSA = 7.48 Gleason score = 3+4 = 7 Unfavorable intermediate risk due to greater than 50% of cores positive. PSMA-PET negative for extraprostatic disease.  Portions of the above documentation were copied from a prior visit for review purposes only.  Allergies: Allergies  Allergen Reactions   Coconut Fatty Acid Anaphylaxis    PMH: Past Medical History:  Diagnosis Date   ANXIETY 07/15/2007   BACK PAIN, RIGHT 07/10/2010   Headache(784.0) 07/15/2007   NEPHROLITHIASIS, HX OF 07/15/2007    PSH: Past Surgical History:  Procedure Laterality Date   HERNIA REPAIR     LIH   PROSTATE BIOPSY      SH: Social History   Tobacco Use   Smoking status: Former    Current packs/day: 0.00    Types: Cigarettes    Start date: 07/03/1994    Quit date: 07/03/2014    Years since quitting: 8.8    Passive exposure: Never   Smokeless tobacco: Never  Vaping Use   Vaping status: Never Used  Substance Use Topics   Alcohol use: No    Alcohol/week: 0.0 standard drinks of alcohol    Comment: rare   Drug use: No     ROS: Constitutional:  Negative for fever, chills, weight loss CV: Negative for chest pain, previous MI, hypertension Respiratory:  Negative for shortness of breath, wheezing, sleep apnea, frequent cough GI:  Negative for nausea, vomiting, bloody stool, GERD  PE: BP (!) 150/92   Pulse 64   Ht 5' 11.5" (1.816 m)   Wt 202 lb (91.6 kg)   BMI 27.78 kg/m  GENERAL APPEARANCE:  Well appearing, well developed, well nourished, NAD HEENT:  Atraumatic, normocephalic COR:  RR LUNGS:  CTA bilaterally ABDOMEN:  Soft, non-tender, no masses EXTREMITIES:  Moves all extremities well, without clubbing, cyanosis, or edema NEUROLOGIC:  Alert and oriented x 3, normal gait, CN II-XII grossly intact MENTAL STATUS:  appropriate BACK:  Non-tender to palpation, No CVAT SKIN:  Warm, dry, and intact   Results: UA clear

## 2023-05-31 ENCOUNTER — Telehealth: Payer: Self-pay | Admitting: *Deleted

## 2023-05-31 NOTE — Anesthesia Preprocedure Evaluation (Signed)
Anesthesia Evaluation  Patient identified by MRN, date of birth, ID band Patient awake    Reviewed: Allergy & Precautions, NPO status , Patient's Chart, lab work & pertinent test results  Airway Mallampati: II  TM Distance: >3 FB Neck ROM: Full    Dental  (+) Chipped, Dental Advisory Given,    Pulmonary former smoker   Pulmonary exam normal breath sounds clear to auscultation       Cardiovascular negative cardio ROS Normal cardiovascular exam Rhythm:Regular Rate:Normal     Neuro/Psych  Headaches PSYCHIATRIC DISORDERS Anxiety        GI/Hepatic negative GI ROS, Neg liver ROS,,,  Endo/Other  negative endocrine ROS    Renal/GU negative Renal ROS   Prostate ca    Musculoskeletal negative musculoskeletal ROS (+)    Abdominal   Peds  Hematology negative hematology ROS (+)   Anesthesia Other Findings   Reproductive/Obstetrics negative OB ROS                             Anesthesia Physical Anesthesia Plan  ASA: 2  Anesthesia Plan: General   Post-op Pain Management: Tylenol PO (pre-op)*   Induction: Intravenous  PONV Risk Score and Plan: 2 and Ondansetron, Dexamethasone, Midazolam and Treatment may vary due to age or medical condition  Airway Management Planned: Oral ETT  Additional Equipment: None  Intra-op Plan:   Post-operative Plan: Extubation in OR  Informed Consent: I have reviewed the patients History and Physical, chart, labs and discussed the procedure including the risks, benefits and alternatives for the proposed anesthesia with the patient or authorized representative who has indicated his/her understanding and acceptance.     Dental advisory given  Plan Discussed with: CRNA  Anesthesia Plan Comments:        Anesthesia Quick Evaluation

## 2023-05-31 NOTE — Telephone Encounter (Signed)
CALLED PATIENT TO REMIND OF PROCEDURE FOR 06-01-23, SPOKE WITH PATIENT AND HE IS AWARE OF THIS PROCEDURE

## 2023-05-31 NOTE — Progress Notes (Addendum)
Received call from pt that was transferred from cancer center, [facility], patient with pre-op instructions for tomorrow's surgery 06-01-2023.  Npo after w/ exception clear liquids (water, gatorade, black coffee/ tea (sugar/ nonsweetner, no honey and no type of cream or milk products) after 0645 after 0645 absolutely nothing by mouth , arrive at 0745 may take flomax and if needed take xanax.  Pt verbalized understanding instructions and understood to do one fleet enema morning of surgery. Pt had no further questions.

## 2023-06-01 ENCOUNTER — Ambulatory Visit (HOSPITAL_BASED_OUTPATIENT_CLINIC_OR_DEPARTMENT_OTHER)
Admission: RE | Admit: 2023-06-01 | Discharge: 2023-06-01 | Disposition: A | Discharge status: Home / Self Care | Payer: Managed Care, Other (non HMO) | Attending: Urology | Admitting: Urology

## 2023-06-01 ENCOUNTER — Encounter (HOSPITAL_BASED_OUTPATIENT_CLINIC_OR_DEPARTMENT_OTHER): Payer: Self-pay | Admitting: Urology

## 2023-06-01 ENCOUNTER — Ambulatory Visit (HOSPITAL_BASED_OUTPATIENT_CLINIC_OR_DEPARTMENT_OTHER): Payer: Managed Care, Other (non HMO) | Admitting: Anesthesiology

## 2023-06-01 ENCOUNTER — Encounter (HOSPITAL_BASED_OUTPATIENT_CLINIC_OR_DEPARTMENT_OTHER)
Admission: RE | Disposition: A | Discharge status: Home / Self Care | Payer: Self-pay | Source: Home / Self Care | Attending: Urology

## 2023-06-01 ENCOUNTER — Ambulatory Visit (HOSPITAL_BASED_OUTPATIENT_CLINIC_OR_DEPARTMENT_OTHER): Payer: Self-pay | Admitting: Anesthesiology

## 2023-06-01 ENCOUNTER — Ambulatory Visit (HOSPITAL_COMMUNITY): Payer: Managed Care, Other (non HMO)

## 2023-06-01 DIAGNOSIS — C61 Malignant neoplasm of prostate: Secondary | ICD-10-CM | POA: Diagnosis not present

## 2023-06-01 DIAGNOSIS — Z87891 Personal history of nicotine dependence: Secondary | ICD-10-CM | POA: Insufficient documentation

## 2023-06-01 HISTORY — PX: RADIOACTIVE SEED IMPLANT: SHX5150

## 2023-06-01 HISTORY — PX: SPACE OAR INSTILLATION: SHX6769

## 2023-06-01 HISTORY — DX: Personal history of urinary calculi: Z87.442

## 2023-06-01 HISTORY — PX: CYSTOSCOPY: SHX5120

## 2023-06-01 HISTORY — DX: Malignant (primary) neoplasm, unspecified: C80.1

## 2023-06-01 SURGERY — INSERTION, RADIATION SOURCE, PROSTATE
Anesthesia: General

## 2023-06-01 MED ORDER — FENTANYL CITRATE (PF) 100 MCG/2ML IJ SOLN
INTRAMUSCULAR | Status: AC
  Filled 2023-06-01: qty 2

## 2023-06-01 MED ORDER — CEFAZOLIN SODIUM 1 G IJ SOLR
INTRAMUSCULAR | Status: AC
  Filled 2023-06-01: qty 20

## 2023-06-01 MED ORDER — ONDANSETRON HCL 4 MG/2ML IJ SOLN: 4.0000 mg | Freq: Once | INTRAMUSCULAR | Status: DC | PRN

## 2023-06-01 MED ORDER — LACTATED RINGERS IV SOLN: INTRAVENOUS | Status: DC

## 2023-06-01 MED ORDER — CEFAZOLIN SODIUM-DEXTROSE 2-3 GM-%(50ML) IV SOLR
INTRAVENOUS | Status: DC | PRN
  Administered 2023-06-01: 2 g via INTRAVENOUS

## 2023-06-01 MED ORDER — SODIUM CHLORIDE 0.9 % IR SOLN
Status: DC | PRN
  Administered 2023-06-01: 1000 mL via INTRAVESICAL

## 2023-06-01 MED ORDER — IOPAMIDOL (ISOVUE-300) INJECTION 61%
INTRAVENOUS | Status: DC | PRN
  Administered 2023-06-01: 7 mL

## 2023-06-01 MED ORDER — MIDAZOLAM HCL 2 MG/2ML IJ SOLN
INTRAMUSCULAR | Status: AC
  Filled 2023-06-01: qty 2

## 2023-06-01 MED ORDER — PROPOFOL 10 MG/ML IV BOLUS
INTRAVENOUS | Status: DC | PRN
  Administered 2023-06-01: 200 mg via INTRAVENOUS

## 2023-06-01 MED ORDER — STERILE WATER FOR IRRIGATION IR SOLN
Status: DC | PRN
  Administered 2023-06-01: 500 mL

## 2023-06-01 MED ORDER — MIDAZOLAM HCL 2 MG/2ML IJ SOLN
INTRAMUSCULAR | Status: DC | PRN
  Administered 2023-06-01: 2 mg via INTRAVENOUS

## 2023-06-01 MED ORDER — PHENYLEPHRINE 80 MCG/ML (10ML) SYRINGE FOR IV PUSH (FOR BLOOD PRESSURE SUPPORT)
PREFILLED_SYRINGE | INTRAVENOUS | Status: AC
Start: 2023-06-01 — End: ?
  Filled 2023-06-01: qty 10

## 2023-06-01 MED ORDER — OXYCODONE HCL 5 MG PO TABS: 5.0000 mg | ORAL_TABLET | Freq: Once | ORAL | Status: DC | PRN

## 2023-06-01 MED ORDER — SUGAMMADEX SODIUM 200 MG/2ML IV SOLN
INTRAVENOUS | Status: DC | PRN
  Administered 2023-06-01: 200 mg via INTRAVENOUS

## 2023-06-01 MED ORDER — FENTANYL CITRATE (PF) 250 MCG/5ML IJ SOLN
INTRAMUSCULAR | Status: DC | PRN
  Administered 2023-06-01 (×2): 50 ug via INTRAVENOUS

## 2023-06-01 MED ORDER — ACETAMINOPHEN 500 MG PO TABS
1000.0000 mg | ORAL_TABLET | Freq: Once | ORAL | Status: AC
  Administered 2023-06-01: 1000 mg via ORAL

## 2023-06-01 MED ORDER — PHENYLEPHRINE 80 MCG/ML (10ML) SYRINGE FOR IV PUSH (FOR BLOOD PRESSURE SUPPORT)
PREFILLED_SYRINGE | INTRAVENOUS | Status: DC | PRN
  Administered 2023-06-01 (×4): 80 ug via INTRAVENOUS

## 2023-06-01 MED ORDER — OXYCODONE HCL 5 MG PO TABS: 5.0000 mg | ORAL_TABLET | Freq: Four times a day (QID) | ORAL | 0 refills | Status: DC | PRN

## 2023-06-01 MED ORDER — ONDANSETRON HCL 4 MG/2ML IJ SOLN
INTRAMUSCULAR | Status: DC | PRN
  Administered 2023-06-01: 4 mg via INTRAVENOUS

## 2023-06-01 MED ORDER — PHENYLEPHRINE 80 MCG/ML (10ML) SYRINGE FOR IV PUSH (FOR BLOOD PRESSURE SUPPORT)
PREFILLED_SYRINGE | INTRAVENOUS | Status: AC
  Filled 2023-06-01: qty 10

## 2023-06-01 MED ORDER — HYDROMORPHONE HCL 1 MG/ML IJ SOLN
0.2500 mg | INTRAMUSCULAR | Status: DC | PRN
Start: 2023-06-01 — End: 2023-06-01

## 2023-06-01 MED ORDER — ROCURONIUM BROMIDE 10 MG/ML (PF) SYRINGE
PREFILLED_SYRINGE | INTRAVENOUS | Status: DC | PRN
  Administered 2023-06-01: 70 mg via INTRAVENOUS

## 2023-06-01 MED ORDER — OXYCODONE HCL 5 MG/5ML PO SOLN: 5.0000 mg | Freq: Once | ORAL | Status: DC | PRN

## 2023-06-01 MED ORDER — ACETAMINOPHEN 500 MG PO TABS
ORAL_TABLET | ORAL | Status: AC
  Filled 2023-06-01: qty 2

## 2023-06-01 MED ORDER — DEXAMETHASONE SODIUM PHOSPHATE 10 MG/ML IJ SOLN
INTRAMUSCULAR | Status: DC | PRN
  Administered 2023-06-01: 10 mg via INTRAVENOUS

## 2023-06-01 MED ORDER — SODIUM CHLORIDE 0.9 % IV SOLN: INTRAVENOUS | Status: DC

## 2023-06-01 MED ORDER — ARTIFICIAL TEARS OPHTHALMIC OINT
TOPICAL_OINTMENT | OPHTHALMIC | Status: AC
  Filled 2023-06-01: qty 3.5

## 2023-06-01 MED ORDER — SODIUM CHLORIDE 0.9 % IV SOLN
INTRAVENOUS | Status: AC | PRN
  Administered 2023-06-01: 10 mL via INTRAMUSCULAR

## 2023-06-01 MED ORDER — LIDOCAINE 2% (20 MG/ML) 5 ML SYRINGE
INTRAMUSCULAR | Status: DC | PRN
  Administered 2023-06-01: 80 mg via INTRAVENOUS

## 2023-06-01 MED ORDER — PROPOFOL 10 MG/ML IV BOLUS
INTRAVENOUS | Status: AC
  Filled 2023-06-01: qty 20

## 2023-06-01 MED ORDER — AMISULPRIDE (ANTIEMETIC) 5 MG/2ML IV SOLN: 10.0000 mg | Freq: Once | INTRAVENOUS | Status: DC | PRN

## 2023-06-01 SURGICAL SUPPLY — 40 items
BAG URINE DRAIN 2000ML AR STRL (UROLOGICAL SUPPLIES) ×2 IMPLANT
BLADE CLIPPER SENSICLIP SURGIC (BLADE) ×2 IMPLANT
CATH FOLEY 2WAY SLVR 5CC 16FR (CATHETERS) ×4 IMPLANT
CATH ROBINSON RED A/P 20FR (CATHETERS) ×2 IMPLANT
CLOTH BEACON ORANGE TIMEOUT ST (SAFETY) ×2 IMPLANT
COVER BACK TABLE 60X90IN (DRAPES) ×2 IMPLANT
COVER MAYO STAND STRL (DRAPES) ×2 IMPLANT
DRSG IV TEGADERM 3.5X4.5 STRL (GAUZE/BANDAGES/DRESSINGS) IMPLANT
DRSG TEGADERM 4X4.75 (GAUZE/BANDAGES/DRESSINGS) ×2 IMPLANT
DRSG TEGADERM 8X12 (GAUZE/BANDAGES/DRESSINGS) ×2 IMPLANT
GAUZE SPONGE 4X4 3PLY NS LF (GAUZE/BANDAGES/DRESSINGS) IMPLANT
GEL ULTRASOUND 20GR AQUASONIC (MISCELLANEOUS) ×2 IMPLANT
GLOVE BIO SURGEON STRL SZ 6.5 (GLOVE) IMPLANT
GLOVE BIO SURGEON STRL SZ7.5 (GLOVE) IMPLANT
GLOVE BIO SURGEON STRL SZ8 (GLOVE) IMPLANT
GLOVE BIOGEL PI IND STRL 6.5 (GLOVE) IMPLANT
GOWN STRL REUS W/ TWL XL LVL3 (GOWN DISPOSABLE) ×2 IMPLANT
GOWN STRL REUS W/TWL XL LVL3 (GOWN DISPOSABLE) ×2
GRID BRACH TEMP 18GA 2.8X3X.75 (MISCELLANEOUS) ×2 IMPLANT
HOLDER FOLEY CATH W/STRAP (MISCELLANEOUS) ×2 IMPLANT
IMPL SPACEOAR VUE SYSTEM (Spacer) ×2 IMPLANT
IMPLANT SPACEOAR VUE SYSTEM (Spacer) ×2 IMPLANT
IV NS 1000ML (IV SOLUTION) ×2
IV NS 1000ML BAXH (IV SOLUTION) ×2 IMPLANT
KIT TURNOVER CYSTO (KITS) IMPLANT
MARKER SKIN DUAL TIP RULER LAB (MISCELLANEOUS) ×2 IMPLANT
NDL BRACHY 18G 5PK (NEEDLE) ×8 IMPLANT
NDL BRACHY 18G SINGLE (NEEDLE) IMPLANT
NDL PK MORGANSTERN STABILIZ (NEEDLE) ×2 IMPLANT
NEEDLE BRACHY 18G 5PK (NEEDLE) ×8
NEEDLE BRACHY 18G SINGLE (NEEDLE)
NEEDLE PK MORGANSTERN STABILIZ (NEEDLE) ×2
PACK CYSTO (CUSTOM PROCEDURE TRAY) ×2 IMPLANT
Quicklink seeds IMPLANT
SHEATH ULTRASOUND LF (SHEATH) IMPLANT
SHEATH ULTRASOUND LTX NONSTRL (SHEATH) IMPLANT
SLEEVE SCD COMPRESS KNEE MED (STOCKING) ×2 IMPLANT
SYR 10ML LL (SYRINGE) ×2 IMPLANT
TOWEL OR 17X24 6PK STRL BLUE (TOWEL DISPOSABLE) ×2 IMPLANT
UNDERPAD 30X36 HEAVY ABSORB (UNDERPADS AND DIAPERS) ×4 IMPLANT

## 2023-06-01 NOTE — Anesthesia Procedure Notes (Signed)
Procedure Name: Intubation Date/Time: 06/01/2023 10:30 AM  Performed by: Dairl Ponder, CRNAPre-anesthesia Checklist: Patient identified, Emergency Drugs available, Suction available and Patient being monitored Patient Re-evaluated:Patient Re-evaluated prior to induction Oxygen Delivery Method: Circle System Utilized Preoxygenation: Pre-oxygenation with 100% oxygen Induction Type: IV induction Ventilation: Mask ventilation without difficulty Laryngoscope Size: Mac and 4 Grade View: Grade II Tube type: Oral Tube size: 7.5 mm Number of attempts: 1 Airway Equipment and Method: Stylet and Oral airway Placement Confirmation: ETT inserted through vocal cords under direct vision, positive ETCO2 and breath sounds checked- equal and bilateral Secured at: 23 cm Tube secured with: Tape Dental Injury: Teeth and Oropharynx as per pre-operative assessment

## 2023-06-01 NOTE — Op Note (Signed)
Preoperative diagnosis: Clinically localized adenocarcinoma of the prostate   Postoperative diagnosis: Clinically localized adenocarcinoma of the prostate  Procedure: 1) Transperineal placement of radioactive seeds into the prostate                    2) Cystoscopy                    3) Insertion of SpaceOAR hydrogel   Surgeon: Concepcion Living, MD  Radiation oncologist: Margaretmary Dys, MD  Anesthesia: General  EBL: Minimal  Complications: None  Indication: Justin Norris is a 52 y.o. gentleman with clinically localized prostate cancer. After discussing management options for treatment, he elected to proceed with radiotherapy. He presents today for the above procedures. The potential risks, complications, alternative options, and expected recovery course have been discussed in detail with the patient and he has provided informed consent to proceed.  Description of procedure: The patient was taken to the operating room and general anesthesia was induced. He was administered preoperative antibiotics, placed in the dorsal lithotomy position, and prepped and draped in the usual sterile fashion. Next, intraoperative transrectal ultrasonography was utilized for real-time intraoperative planning by the radiation oncology team. Once the treatment plan was completed and the seed strands created, stranded iodine 125 radiation seeds were placed utilizing a brachytherapy perineal template. 68 radioactive iodine 125 seeds were placed into the prostate through 19 catheter needles.  The brachytherapy template was then removed.  A site in the midline was selected on the perineum for placement of an 18 g needle with saline.  The needle was advanced above the rectum and below Denonvillier's fascia to the mid gland and confirmed to be in the midline on transverse imaging.  One cc of saline was injected confirming appropriate expansion of this space.  A total of 5 cc of saline was then injected to open the space  further bilaterally.  The saline syringe was then removed and the SpaceOAR hydrogel was injected with good distribution bilaterally. Position of the radiation seeds was confirmed on fluoroscopic imaging.  Flexible cystoscopy was then performed and no seeds were identified within the bladder.  No bladder tumors, stones, or other mucosal pathology was identified within the bladder. He tolerated the procedure well and without complications. He was able to be transferred to the recovery unit in satisfactory condition.  He was given a voiding trial in the PACU.

## 2023-06-01 NOTE — Transfer of Care (Signed)
Immediate Anesthesia Transfer of Care Note  Patient: Justin Norris  Procedure(s) Performed: RADIOACTIVE SEED IMPLANT/BRACHYTHERAPY IMPLANT (Bilateral) SPACE OAR INSTILLATION (Bilateral) CYSTOSCOPY  Patient Location: PACU  Anesthesia Type:General  Level of Consciousness: drowsy and patient cooperative  Airway & Oxygen Therapy: Patient Spontanous Breathing  Post-op Assessment: Report given to RN and Post -op Vital signs reviewed and stable  Post vital signs: Reviewed and stable  Last Vitals:  Vitals Value Taken Time  BP 120/89 06/01/23 1155  Temp 36.4 C 06/01/23 1156  Pulse 73 06/01/23 1158  Resp 12 06/01/23 1158  SpO2 95 % 06/01/23 1158  Vitals shown include unfiled device data.  Last Pain:  Vitals:   06/01/23 1156  TempSrc:   PainSc: Asleep      Patients Stated Pain Goal: 7 (06/01/23 1156)  Complications: No notable events documented.

## 2023-06-01 NOTE — Discharge Instructions (Signed)
   No acetaminophen/Tylenol until after 2:30 pm today if needed.       ost Anesthesia Home Care Instructions  Activity: Get plenty of rest for the remainder of the day. A responsible individual must stay with you for 24 hours following the procedure.  For the next 24 hours, DO NOT: -Drive a car -Advertising copywriter -Drink alcoholic beverages -Take any medication unless instructed by your physician -Make any legal decisions or sign important papers.  Meals: Start with liquid foods such as gelatin or soup. Progress to regular foods as tolerated. Avoid greasy, spicy, heavy foods. If nausea and/or vomiting occur, drink only clear liquids until the nausea and/or vomiting subsides. Call your physician if vomiting continues.  Special Instructions/Symptoms: Your throat may feel dry or sore from the anesthesia or the breathing tube placed in your throat during surgery. If this causes discomfort, gargle with warm salt water. The discomfort should disappear within 24 hours.

## 2023-06-01 NOTE — Interval H&P Note (Signed)
History and Physical Interval Note:  06/01/2023 9:05 AM  Justin Norris  has presented today for surgery, with the diagnosis of prostate cancer.  The various methods of treatment have been discussed with the patient and family. After consideration of risks, benefits and other options for treatment, the patient has consented to  Procedure(s): RADIOACTIVE SEED IMPLANT/BRACHYTHERAPY IMPLANT (Bilateral) SPACE OAR INSTILLATION (Bilateral) CYSTOSCOPY (N/A) as a surgical intervention.  The patient's history has been reviewed, patient examined, no change in status, stable for surgery.  I have reviewed the patient's chart and labs.  Questions were answered to the patient's satisfaction.     Justin Norris

## 2023-06-01 NOTE — Anesthesia Postprocedure Evaluation (Signed)
Anesthesia Post Note  Patient: Justin Norris  Procedure(s) Performed: RADIOACTIVE SEED IMPLANT/BRACHYTHERAPY IMPLANT (Bilateral) SPACE OAR INSTILLATION (Bilateral) CYSTOSCOPY     Patient location during evaluation: PACU Anesthesia Type: General Level of consciousness: awake and alert, oriented and patient cooperative Pain management: pain level controlled Vital Signs Assessment: post-procedure vital signs reviewed and stable Respiratory status: spontaneous breathing, nonlabored ventilation and respiratory function stable Cardiovascular status: blood pressure returned to baseline and stable Postop Assessment: no apparent nausea or vomiting Anesthetic complications: no   No notable events documented.  Last Vitals:  Vitals:   06/01/23 1200 06/01/23 1215  BP: 118/71 136/86  Pulse: 80 74  Resp: 16 14  Temp:    SpO2: 97% 93%    Last Pain:  Vitals:   06/01/23 1215  TempSrc:   PainSc: 0-No pain                 Lannie Fields

## 2023-06-01 NOTE — Progress Notes (Signed)
  Radiation Oncology         [phone] ________________________________  Name: Justin Norris MRN: [id number]  Date: 06/01/2023  DOB: Dec 08, 1970       Prostate Seed Implant  WU:[id number], [name], DO  No ref. provider found  DIAGNOSIS:  52 y.o. 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   Oncology History  Prostate cancer [facility]); PSA 7.48; GG 1,2; unfavorable intermediate risk (>50% cores +)  01/13/2023 Cancer Staging   Staging form: Prostate, AJCC 8th Edition - Clinical stage from 01/13/2023: Stage IIB (cT1c, cN0, cM0, PSA: 7.5, Grade Group: 2) - Signed by Marcello Fennel, PA-C on 03/10/2023 Histopathologic type: Adenocarcinoma, NOS Stage prefix: Initial diagnosis Prostate specific antigen (PSA) range: Less than 10 Gleason primary pattern: 3 Gleason secondary pattern: 4 Gleason score: 7 Histologic grading system: 5 grade system Number of biopsy cores examined: 12 Number of biopsy cores positive: 8 Location of positive needle core biopsies: Both sides   01/20/2023 Initial Diagnosis   Prostate cancer (HCC); PSA 7.48; GG 1,2; unfavorable intermediate risk (>50% cores +)    PROCEDURE: Insertion of radioactive I-125 seeds into the prostate gland.  RADIATION DOSE: 145 Gy, definitive therapy.  TECHNIQUE: ZALMEN SADEK was brought to the operating room with the urologist. He was placed in the dorsolithotomy position. He was catheterized and a rectal tube was inserted. The perineum was shaved, prepped and draped. The ultrasound probe was then introduced by me into the rectum to see the prostate gland.  TREATMENT DEVICE: I attached the needle grid to the ultrasound probe stand and anchor needles were placed.  3D PLANNING: The prostate was imaged in 3D using a sagittal sweep of the prostate probe. These images were transferred to the planning computer. There, the prostate, urethra and rectum were defined on each axial reconstructed  image. Then, the software created an optimized 3D plan and a few seed positions were adjusted. The quality of the plan was reviewed using [facility] information for the target and the following two organs at risk:  Urethra and Rectum.  Then the accepted plan was printed and handed off to the radiation therapist.  Under my supervision, the custom loading of the seeds and spacers was carried out using the quick loader.  These pre-loaded needles were then placed into the needle holder.Marland Kitchen  PROSTATE VOLUME STUDY:  Using transrectal ultrasound the volume of the prostate was verified to be 29 cc.  SPECIAL TREATMENT PROCEDURE/SUPERVISION AND HANDLING: The pre-loaded needles were then delivered by the urologist under sagittal guidance. A total of 19 needles were used to deposit 68 seeds in the prostate gland. The individual seed activity was 0.347 mCi.  SpaceOAR:  Yes  COMPLEX SIMULATION: At the end of the procedure, an anterior radiograph of the pelvis was obtained to document seed positioning and count. Cystoscopy was performed by the urologist to check the urethra and bladder.  MICRODOSIMETRY: At the end of the procedure, the patient was emitting 0.05 mR/hr at 1 meter. Accordingly, he was considered safe for hospital discharge.  PLAN: The patient will return to the radiation oncology clinic for post implant CT dosimetry in three weeks.   ________________________________  Artist Pais Kathrynn Running, M.D.

## 2023-06-03 ENCOUNTER — Encounter (HOSPITAL_BASED_OUTPATIENT_CLINIC_OR_DEPARTMENT_OTHER): Payer: Self-pay | Admitting: Urology

## 2023-06-11 NOTE — Progress Notes (Signed)
Patient was a RadOnc Consult on 03/10/23 for his stage T1c adenocarcinoma of the prostate with Gleason score of 3+4, and PSA of 7.48. Patient proceed with treatment recommendations of brachytherapy and had his treatment on 06/01/23.   Patient is scheduled for a post seed CT Simulation on 12/4 and urology follow up on 12/12 with Dr. Margo Aye.

## 2023-06-22 ENCOUNTER — Telehealth: Payer: Self-pay | Admitting: *Deleted

## 2023-06-22 NOTE — Telephone Encounter (Signed)
CALLED PATIENT TO REMIND OF POST SEED APPTS. FOR 08-12-22, LVM FOR A RETURN CALL

## 2023-06-23 ENCOUNTER — Ambulatory Visit
Admission: RE | Admit: 2023-06-23 | Discharge: 2023-06-23 | Disposition: A | Discharge status: Home / Self Care | Payer: Managed Care, Other (non HMO) | Source: Ambulatory Visit | Attending: Radiation Oncology | Admitting: Radiation Oncology

## 2023-06-23 ENCOUNTER — Ambulatory Visit
Admission: RE | Admit: 2023-06-23 | Discharge: 2023-06-23 | Disposition: A | Discharge status: Home / Self Care | Payer: Managed Care, Other (non HMO) | Source: Ambulatory Visit | Attending: Urology | Admitting: Urology

## 2023-06-23 ENCOUNTER — Encounter: Payer: Self-pay | Admitting: Urology

## 2023-06-23 VITALS — BP 139/94 | HR 80 | Temp 97.5°F | Resp 18 | Ht 71.5 in | Wt 208.0 lb

## 2023-06-23 DIAGNOSIS — C61 Malignant neoplasm of prostate: Secondary | ICD-10-CM

## 2023-06-23 DIAGNOSIS — Z79899 Other long term (current) drug therapy: Secondary | ICD-10-CM | POA: Diagnosis not present

## 2023-06-23 DIAGNOSIS — Z923 Personal history of irradiation: Secondary | ICD-10-CM | POA: Insufficient documentation

## 2023-06-23 NOTE — Progress Notes (Signed)
  Radiation Oncology         [phone]) [phone] ________________________________  Name: TAURENCE GOOS MRN: [id number]  Date: 06/23/2023  DOB: Nov 22, 1970  COMPLEX SIMULATION NOTE  NARRATIVE:  The patient was brought to the CT Simulation planning suite today following prostate seed implantation approximately one month ago.  Identity was confirmed.  All relevant records and images related to the planned course of therapy were reviewed.  Then, the patient was set-up supine.  CT images were obtained.  The CT images were loaded into the planning software.  Then the prostate and rectum were contoured.  Treatment planning then occurred.  The implanted iodine 125 seeds were identified by the physics staff for projection of radiation distribution  I have requested : 3D Simulation  I have requested a DVH of the following structures: Prostate and rectum.    ________________________________  Artist Pais Kathrynn Running, M.D.

## 2023-06-23 NOTE — Progress Notes (Signed)
Radiation Oncology         [phone] ________________________________  Name: Justin Norris MRN: [id number]  Date: 06/23/2023  DOB: August 13, 1970  Post-Seed Follow-Up Visit Note  CC: [name], DO  [name], DO  Diagnosis:   52 y.o. 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     ICD-10-CM   1. Prostate cancer (HCC); PSA 7.48; GG 1,2; unfavorable intermediate risk (>50% cores +)  C61       Interval Since Last Radiation:  3 weeks 06/01/23:  Insertion of radioactive I-125 seeds into the prostate gland; 145 Gy, definitive therapy with placement of SpaceOAR gel.  Narrative:  The patient returns today for routine follow-up.  He is complaining of increased urinary frequency and urinary hesitation symptoms. He filled out a questionnaire regarding urinary function today providing and overall IPSS score of 18 characterizing his symptoms as moderate-severe with nocturia x5, frequency and weak flow of stream despite taking Flomax daily as prescribed.  He does feel like the lUTS are gradually improving and specifically denies gross hematuria, dysuria, flank pain, fever or chills. His pre-implant score was 14. He denies any abdominal pain or bowel symptoms but does have some rectal pressure/fullness. Overall, he is pleased with his progress to date.  ALLERGIES:  is allergic to coconut fatty acid.  Meds: Current Outpatient Medications  Medication Sig Dispense Refill   ALPRAZolam (XANAX) 0.5 MG tablet TAKE 1 TABLET BY MOUTH TWICE A DAY AS NEEDED . RX MUST LAST 30 DAYS 45 tablet 1   doxycycline (VIBRA-TABS) 100 MG tablet Take 1 tablet (100 mg total) by mouth 2 (two) times daily. 20 tablet 0   mupirocin ointment (BACTROBAN) 2 % Apply 1 Application topically 2 (two) times daily. 22 g 0   oxyCODONE (ROXICODONE) 5 MG immediate release tablet Take 1 tablet (5 mg total) by mouth every 6 (six) hours as needed for severe pain (pain score  7-10). 10 tablet 0   tadalafil (CIALIS) 20 MG tablet TAKE 1 TABLET BY MOUTH EVERY DAY AS NEEDED FOR ERECTILE DYSFUNCTION 5 tablet 23   tamsulosin (FLOMAX) 0.4 MG CAPS capsule Take 1 capsule (0.4 mg total) by mouth daily. 90 capsule 3   No current facility-administered medications for this visit.    Physical Findings: In general this is a well appearing Caucasian male in no acute distress. He's alert and oriented x4 and appropriate throughout the examination. Cardiopulmonary assessment is negative for acute distress and he exhibits normal effort.   Lab Findings: Lab Results  Component Value Date   WBC 8.3 09/07/2022   HGB 16.9 09/07/2022   HCT 51.0 (H) 09/07/2022   MCV 81.5 09/07/2022   PLT 341 09/07/2022    Radiographic Findings:  Patient underwent CT imaging in our clinic for post implant dosimetry. The CT will be reviewed by Dr. Kathrynn Running to confirm there is an adequate distribution of radioactive seeds throughout the prostate gland and ensure that there are no seeds in or near the rectum.  We suspect the final radiation plan and dosimetry will show appropriate coverage of the prostate gland. He understands that we will call and inform him of any unexpected findings on further review of his imaging and dosimetry.  Impression/Plan: 52 y.o. 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  The patient is recovering from the effects of radiation. His urinary symptoms should gradually improve over the next  4-6 months. We talked about this today. He is encouraged by his improvement already and is otherwise pleased with his outcome. He will continue taking the Flomax daily as prescribed and will refrain from any prolonged close contact with is kids for an additional 3 weeks. At 6 weeks post-treatment, contact precautions are lifted and he can resume all normal contact and activities with his kids. We also talked about long-term follow-up for  prostate cancer following seed implant. He understands that ongoing PSA determinations and digital rectal exams will help perform surveillance to rule out disease recurrence. He has a follow up appointment scheduled with Dr. Margo Aye on 07/01/23. He understands what to expect with his PSA measures. Patient was also educated today about some of the long-term effects from radiation including a small risk for rectal bleeding and possibly erectile dysfunction. We talked about some of the general management approaches to these potential complications. However, I did encourage the patient to contact our office or return at any point if he has questions or concerns related to his previous radiation and prostate cancer.    Marguarite Arbour, PA-C

## 2023-06-23 NOTE — Progress Notes (Addendum)
Post-seed nursing interview for a diagnosis of Stage T1c adenocarcinoma of the prostate with Gleason score of 3+4, and PSA of 7.48.  Patient identity verified x2.   Patient reports groin pressure and loose stools. Patient denies all other related issues at this time.  Meaningful use complete.  I-PSS score- 18 - Moderate SHIM score- 15 Urinary Management medication(s) Tamsulosin Urology appointment date- Pt. states...07/01/2023 with Dr. [name] at [facility]  Vitals- BP (!) 139/94 (BP Location: Left Arm, Patient Position: Sitting, Cuff Size: Normal)   Pulse 80   Temp (!) 97.5 F (36.4 C) (Temporal)   Resp 18   Ht 5' 11.5" (1.816 m)   Wt 208 lb (94.3 kg)   SpO2 97%   BMI 28.61 kg/m   This concludes the interaction.  [name], LPN

## 2023-06-24 ENCOUNTER — Ambulatory Visit
Admission: RE | Admit: 2023-06-24 | Discharge: 2023-06-24 | Disposition: A | Discharge status: Home / Self Care | Payer: Managed Care, Other (non HMO) | Source: Ambulatory Visit | Attending: Radiation Oncology | Admitting: Radiation Oncology

## 2023-06-24 ENCOUNTER — Encounter: Payer: Self-pay | Admitting: Radiation Oncology

## 2023-06-24 DIAGNOSIS — C61 Malignant neoplasm of prostate: Secondary | ICD-10-CM | POA: Insufficient documentation

## 2023-06-24 NOTE — Radiation Completion Notes (Signed)
Patient Name: Justin Norris, Justin Norris MRN: [id number] Date of Birth: Nov 26, 1970 Referring Physician: [name], M.D. Date of Service: 2023-06-24 Radiation Oncologist: Margaretmary Bayley, M.D. [facility]                             RADIATION ONCOLOGY END OF TREATMENT NOTE     Diagnosis: C61 Malignant neoplasm of prostate Staging on 2023-01-13: Prostate cancer [facility]); PSA 7.48; GG 1,2; unfavorable intermediate risk (>50% cores +) T=cT1c, N=cN0, M=cM0 Intent: Curative     ==========DELIVERED PLANS==========  Prostate Seed Implant Date: 2023-06-01   Plan Name: Prostate Seed Implant Site: Prostate Technique: Radioactive Seed Implant I-125 Mode: Brachytherapy Dose Per Fraction: 145 Gy Prescribed Dose (Delivered / Prescribed): 145 Gy / 145 Gy Prescribed Fxs (Delivered / Prescribed): 1 / 1     ==========ON TREATMENT VISIT DATES========== 2023-06-01     ==========UPCOMING VISITS==========

## 2023-06-25 NOTE — Progress Notes (Signed)
  Radiation Oncology         [phone]) [phone] ________________________________  Name: Justin Norris MRN: [id number]  Date: 06/24/2023  DOB: [birth year]  3D Planning Note   Prostate Brachytherapy Post-Implant Dosimetry  Diagnosis: 52 y.o. 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   Narrative: On a previous date, Justin Norris returned following prostate seed implantation for post implant planning. He underwent CT scan complex simulation to delineate the three-dimensional structures of the pelvis and demonstrate the radiation distribution.  Since that time, the seed localization, and complex isodose planning with dose volume histograms have now been completed.  Results:   Prostate Coverage - The dose of radiation delivered to the 90% or more of the prostate gland (D90) was 117.38% of the prescription dose. This exceeds our goal of greater than 90%. Rectal Sparing - The volume of rectal tissue receiving the prescription dose or higher was 0.0 cc. This falls under our thresholds tolerance of 1.0 cc.  Impression: The prostate seed implant appears to show adequate target coverage and appropriate rectal sparing.  Plan:  The patient will continue to follow with urology for ongoing PSA determinations. I would anticipate a high likelihood for local tumor control with minimal risk for rectal morbidity.  ________________________________  Artist Pais Kathrynn Running, M.D.

## 2023-07-01 ENCOUNTER — Ambulatory Visit (INDEPENDENT_AMBULATORY_CARE_PROVIDER_SITE_OTHER): Payer: Managed Care, Other (non HMO) | Admitting: Urology

## 2023-07-01 ENCOUNTER — Encounter: Payer: Self-pay | Admitting: Urology

## 2023-07-01 VITALS — BP 152/90 | HR 74 | Ht 71.5 in | Wt 203.0 lb

## 2023-07-01 DIAGNOSIS — C61 Malignant neoplasm of prostate: Secondary | ICD-10-CM | POA: Diagnosis not present

## 2023-07-01 MED ORDER — TAMSULOSIN HCL 0.4 MG PO CAPS: 0.8000 mg | ORAL_CAPSULE | Freq: Every day | ORAL | 3 refills | Status: AC

## 2023-07-01 NOTE — Addendum Note (Signed)
Addended by: [name] on: 07/01/2023 02:12 PM   Modules accepted: Orders

## 2023-07-01 NOTE — Progress Notes (Signed)
   Assessment: 1. Prostate cancer [facility])     Plan: Doing well following LDR implant and SpaceOAR insertion.  We did discuss his lower urinary tract symptoms and I recommend that we try doubling up on his tamsulosin. Follow-up 4 months with PSA prior to visit  Chief Complaint: Prostate cancer  HPI: Justin Norris is a 52 y.o. male who presents for continued evaluation of prostate cancer and lower urinary tract symptoms.. Patient is 4 weeks out following LDR implant with insertion of SpaceOAR. He is on tamsulosin. He does report perineal pressure and increased lower urinary tract symptoms.  Current IPSS = 17. PVR today = 0 UA today entirely clear  Portions of the above documentation were copied from a prior visit for review purposes only.  Allergies: Allergies  Allergen Reactions   Coconut Fatty Acid Anaphylaxis    PMH: Past Medical History:  Diagnosis Date   ANXIETY 07/15/2007   BACK PAIN, RIGHT 07/10/2010   Cancer (HCC)    Headache(784.0) 07/15/2007   History of kidney stones    NEPHROLITHIASIS, HX OF 07/15/2007    PSH: Past Surgical History:  Procedure Laterality Date   CYSTOSCOPY N/A 06/01/2023   Procedure: CYSTOSCOPY;  Surgeon: [name], MD;  Location: [facility];  Service: Urology;  Laterality: N/A;   HERNIA REPAIR     LIH   PROSTATE BIOPSY     RADIOACTIVE SEED IMPLANT Bilateral 06/01/2023   Procedure: RADIOACTIVE SEED IMPLANT/BRACHYTHERAPY IMPLANT;  Surgeon: [name], MD;  Location: [facility];  Service: Urology;  Laterality: Bilateral;   SPACE OAR INSTILLATION Bilateral 06/01/2023   Procedure: SPACE OAR INSTILLATION;  Surgeon: [name], MD;  Location: [facility];  Service: Urology;  Laterality: Bilateral;    SH: Social History   Tobacco Use   Smoking status: Former    Current packs/day: 0.00    Types: Cigarettes    Start date: 07/03/1994    Quit date: 07/03/2014    Years since  quitting: 9.0    Passive exposure: Never   Smokeless tobacco: Never  Vaping Use   Vaping status: Never Used  Substance Use Topics   Alcohol use: No    Alcohol/week: 0.0 standard drinks of alcohol    Comment: rare   Drug use: No    ROS: Constitutional:  Negative for fever, chills, weight loss CV: Negative for chest pain, previous MI, hypertension Respiratory:  Negative for shortness of breath, wheezing, sleep apnea, frequent cough GI:  Negative for nausea, vomiting, bloody stool, GERD  PE: BP (!) 152/90   Pulse 74   Ht 5' 11.5" (1.816 m)   Wt 203 lb (92.1 kg)   BMI 27.92 kg/m  GENERAL APPEARANCE:  Well appearing, well developed, well nourished, NAD    Results: UA clear

## 2023-07-02 LAB — URINALYSIS, ROUTINE W REFLEX MICROSCOPIC
Bilirubin, UA: NEGATIVE
Glucose, UA: NEGATIVE
Ketones, UA: NEGATIVE
Leukocytes,UA: NEGATIVE
Nitrite, UA: NEGATIVE
Protein,UA: NEGATIVE
RBC, UA: NEGATIVE
Specific Gravity, UA: 1.025 (ref 1.005–1.030)
Urobilinogen, Ur: 0.2 mg/dL (ref 0.2–1.0)
pH, UA: 7 (ref 5.0–7.5)

## 2023-07-05 ENCOUNTER — Encounter: Payer: Self-pay | Admitting: *Deleted

## 2023-07-06 ENCOUNTER — Ambulatory Visit (INDEPENDENT_AMBULATORY_CARE_PROVIDER_SITE_OTHER): Payer: Managed Care, Other (non HMO) | Admitting: Family Medicine

## 2023-07-06 ENCOUNTER — Encounter: Payer: Self-pay | Admitting: *Deleted

## 2023-07-06 VITALS — BP 118/80 | HR 63 | Ht 71.5 in | Wt 211.0 lb

## 2023-07-06 DIAGNOSIS — L03113 Cellulitis of right upper limb: Secondary | ICD-10-CM | POA: Diagnosis not present

## 2023-07-06 DIAGNOSIS — J320 Chronic maxillary sinusitis: Secondary | ICD-10-CM | POA: Diagnosis not present

## 2023-07-06 DIAGNOSIS — C61 Malignant neoplasm of prostate: Secondary | ICD-10-CM

## 2023-07-06 DIAGNOSIS — J329 Chronic sinusitis, unspecified: Secondary | ICD-10-CM | POA: Insufficient documentation

## 2023-07-06 MED ORDER — DOXYCYCLINE HYCLATE 100 MG PO TABS: 100.0000 mg | ORAL_TABLET | Freq: Two times a day (BID) | ORAL | 0 refills | Status: AC

## 2023-07-06 MED ORDER — METHYLPREDNISOLONE ACETATE 80 MG/ML IJ SUSP
80.0000 mg | Freq: Once | INTRAMUSCULAR | Status: AC
  Administered 2023-07-06: 80 mg via INTRAMUSCULAR

## 2023-07-06 NOTE — Assessment & Plan Note (Signed)
Ulcerated sore right forearm.  Repeat course of doxycycline sent in.  If area is not completely resolving we may need to consider biopsy of area

## 2023-07-06 NOTE — Assessment & Plan Note (Signed)
Injection of Depo-Medrol given today.

## 2023-07-06 NOTE — Assessment & Plan Note (Signed)
Status post brachytherapy.  Overall doing well at this time.

## 2023-07-06 NOTE — Progress Notes (Signed)
Justin Norris - 52 y.o. male MRN [id number]  Date of birth: 04-Apr-1971  Subjective Chief Complaint  Patient presents with   Cellulitis   Urinary Frequency    HPI Justin Norris is a 52 y.o. male here today for follow up.   He reports that he is doing pretty well.Marland Kitchen  He is s/p LDR implant.  He is having some increased lower urinary tract symptoms and tamsulosin was recently doubled.  This does seem to be improved at this point.  Still with sore area on R forearm.  Completed doxycycline previously.  This did improve with doxycycline but has returned.  Denies drainage.  Having some sinus congestion.  This previously improved with Depo-Medrol injection.  He would like to have repeat injection.  ROS:  A comprehensive ROS was completed and negative except as noted per HPI  Allergies  Allergen Reactions   Coconut Fatty Acid Anaphylaxis    Past Medical History:  Diagnosis Date   ANXIETY 07/15/2007   BACK PAIN, RIGHT 07/10/2010   Cancer (HCC)    Headache(784.0) 07/15/2007   History of kidney stones    NEPHROLITHIASIS, HX OF 07/15/2007    Past Surgical History:  Procedure Laterality Date   CYSTOSCOPY N/A 06/01/2023   Procedure: CYSTOSCOPY;  Surgeon: [name], MD;  Location: [facility];  Service: Urology;  Laterality: N/A;   HERNIA REPAIR     LIH   PROSTATE BIOPSY     RADIOACTIVE SEED IMPLANT Bilateral 06/01/2023   Procedure: RADIOACTIVE SEED IMPLANT/BRACHYTHERAPY IMPLANT;  Surgeon: [name], MD;  Location: [facility];  Service: Urology;  Laterality: Bilateral;   SPACE OAR INSTILLATION Bilateral 06/01/2023   Procedure: SPACE OAR INSTILLATION;  Surgeon: [name], MD;  Location: [facility];  Service: Urology;  Laterality: Bilateral;    Social History   Socioeconomic History   Marital status: Married    Spouse name: Not on file   Number of children: 9   Years of education: Not on file   Highest education  level: Bachelor's degree (e.g., BA, AB, BS)  Occupational History   Occupation: Environmental health practitioner: [employer]  Tobacco Use   Smoking status: Former    Current packs/day: 0.00    Types: Cigarettes    Start date: 07/03/1994    Quit date: 07/03/2014    Years since quitting: 9.0    Passive exposure: Never   Smokeless tobacco: Never  Vaping Use   Vaping status: Never Used  Substance and Sexual Activity   Alcohol use: No    Alcohol/week: 0.0 standard drinks of alcohol    Comment: rare   Drug use: No   Sexual activity: Yes    Partners: Female  Other Topics Concern   Not on file  Social History Narrative   Not on file   Social Drivers of Health   Financial Resource Strain: Low Risk  (07/06/2023)   Overall Financial Resource Strain (CARDIA)    Difficulty of Paying Living Expenses: Not hard at all  Food Insecurity: No Food Insecurity (07/06/2023)   Hunger Vital Sign    Worried About Running Out of Food in the Last Year: Never true    Ran Out of Food in the Last Year: Never true  Transportation Needs: No Transportation Needs (07/06/2023)   PRAPARE - Administrator, Civil Service (Medical): No    Lack of Transportation (Non-Medical): No  Physical Activity: Sufficiently Active (07/06/2023)  Exercise Vital Sign    Days of Exercise per Week: 5 days    Minutes of Exercise per Session: 30 min  Stress: No Stress Concern Present (07/06/2023)   [employer] of Occupational Health - Occupational Stress Questionnaire    Feeling of Stress : Only a little  Social Connections: Socially Integrated (07/06/2023)   Social Connection and Isolation Panel [NHANES]    Frequency of Communication with Friends and Family: More than three times a week    Frequency of Social Gatherings with Friends and Family: More than three times a week    Attends Religious Services: More than 4 times per year    Active Member of Clubs or Organizations: Yes    Attends Museum/gallery exhibitions officer: More than 4 times per year    Marital Status: Married    Family History  Problem Relation Age of Onset   Heart disease Father    Skin cancer Father     Health Maintenance  Topic Date Due   HIV Screening  Never done   Hepatitis C Screening  Never done   Colonoscopy  Never done   Zoster Vaccines- Shingrix (2 of 2) 02/01/2023   COVID-19 Vaccine (6 - 2024-25 season) 03/21/2023   INFLUENZA VACCINE  10/18/2023 (Originally 02/18/2023)   DTaP/Tdap/Td (2 - Td or Tdap) 12/06/2032   HPV VACCINES  Aged Out     ----------------------------------------------------------------------------------------------------------------------------------------------------------------------------------------------------------------- Physical Exam BP 118/80 (BP Location: Left Arm, Patient Position: Sitting, Cuff Size: Normal)   Pulse 63   Ht 5' 11.5" (1.816 m)   Wt 211 lb (95.7 kg)   SpO2 97%   BMI 29.02 kg/m   Physical Exam Constitutional:      Appearance: Normal appearance.  HENT:     Head: Normocephalic and atraumatic.  Eyes:     General: No scleral icterus. Cardiovascular:     Rate and Rhythm: Normal rate and regular rhythm.  Pulmonary:     Effort: Pulmonary effort is normal.     Breath sounds: Normal breath sounds.  Neurological:     Mental Status: He is alert.  Psychiatric:        Mood and Affect: Mood normal.        Behavior: Behavior normal.     ------------------------------------------------------------------------------------------------------------------------------------------------------------------------------------------------------------------- Assessment and Plan  Cellulitis of arm, right Ulcerated sore right forearm.  Repeat course of doxycycline sent in.  If area is not completely resolving we may need to consider biopsy of area  Prostate cancer (HCC); PSA 7.48; GG 1,2; unfavorable intermediate risk (>50% cores +) Status post brachytherapy.   Overall doing well at this time.  Sinusitis Injection of Depo-Medrol given today.   Meds ordered this encounter  Medications   doxycycline (VIBRA-TABS) 100 MG tablet    Sig: Take 1 tablet (100 mg total) by mouth 2 (two) times daily for 10 days.    Dispense:  20 tablet    Refill:  0   methylPREDNISolone acetate (DEPO-MEDROL) injection 80 mg    No follow-ups on file.    This visit occurred during the SARS-CoV-2 public health emergency.  Safety protocols were in place, including screening questions prior to the visit, additional usage of staff PPE, and extensive cleaning of exam room while observing appropriate contact time as indicated for disinfecting solutions.

## 2023-08-03 ENCOUNTER — Inpatient Hospital Stay: Payer: Managed Care, Other (non HMO) | Attending: Nurse Practitioner | Admitting: *Deleted

## 2023-08-03 ENCOUNTER — Encounter: Payer: Self-pay | Admitting: *Deleted

## 2023-08-03 DIAGNOSIS — C61 Malignant neoplasm of prostate: Secondary | ICD-10-CM

## 2023-08-03 NOTE — Progress Notes (Signed)
 SCP reviewed and completed. Post-tx PSA labs will be in April w/ Dr. Shona. Pt is very nervous about the unknown of the radioactive seeds being in his body. I am providing some literature for him to better understand what has taken place. I will get some literature from Ashlyn B., PA. I also suggested pt to try to join our Prostate Support group. Pt has never has colonoscopy. I suggested at least a cologuard test.

## 2023-09-16 ENCOUNTER — Telehealth: Payer: Self-pay

## 2023-09-16 NOTE — Telephone Encounter (Signed)
 Left msg that symptoms were common and should resolve on their own. If any further questions/concerns, call the office at [phone].

## 2023-09-16 NOTE — Telephone Encounter (Signed)
 Patient called to reported that he has had two episdoes of blood in his stool since having the brachy therapy. He wants to know if this is normal and to to be expected.

## 2023-09-27 ENCOUNTER — Ambulatory Visit: Payer: Self-pay | Admitting: Family Medicine

## 2023-09-27 NOTE — Telephone Encounter (Signed)
 Copied from CRM [id number]. Topic: Clinical - Red Word Triage >> Sep 27, 2023 11:00 AM [place] B wrote: [facility] that prompted transfer to Nurse Triage: pt has pain in left foot with gout   Chief Complaint: Left Foot/Ankle Pain hurts to big toe Symptoms: Pain, swelling, discoloration of skin--pt states raspberry color not purple Frequency: 4 days Pertinent Negatives: Patient denies injuries  Disposition: [] ED /[] Urgent Care (no appt availability in office) / [x] Appointment(In office/virtual)/ []  [place] [facility]/ [] Home Care/ [] Refused Recommended Disposition /[] [place] [facility]/ []  Follow-up with PCP Additional Notes: Patient called and advised that he has been having pain in his left foot/ankle and he has been told in the past that he has had treated. This has been going on for 4 days.  He states that there is pain, some swelling, and skin appears raspberry colored.  He denies any injuries and had been taking ibuprofen and elevating this foot each night. Patient denies any injuries that he is aware of. Patient states he had a medication with in the past 6 months-1 year and now it seems to have flared up again. Appointment is made for tomorrow 09/28/2023 at 11:10 am with Christen Butter at patient's PCP office. Patient is also advised that if anything worsens to go to the emergency room.  Patient verbalized understanding.  Reason for Disposition  [1] Swollen foot AND [2] no fever  (Exceptions: localized bump from bunions, calluses, insect bite, sting)  Answer Assessment - Initial Assessment Questions 1. ONSET: "When did the pain start?"      4 days ago 2. LOCATION: "Where is the pain located?"      Left Foot 3. PAIN: "How bad is the pain?"    (Scale 1-10; or mild, moderate, severe)  - MILD (1-3): doesn't interfere with normal activities.   - MODERATE (4-7): interferes with normal activities (e.g., work or school) or awakens from sleep, limping.   - SEVERE (8-10): excruciating pain,  unable to do any normal activities, unable to walk.      5 4. WORK OR EXERCISE: "Has there been any recent work or exercise that involved this part of the body?"      Daily use 5. CAUSE: "What do you think is causing the foot pain?"     Possibly gout 6. OTHER SYMPTOMS: "Do you have any other symptoms?" (e.g., leg pain, rash, fever, numbness)     Slight swelling, discoloration  Protocols used: Foot Pain-A-AH

## 2023-09-28 ENCOUNTER — Encounter: Payer: Self-pay | Admitting: Medical-Surgical

## 2023-09-28 ENCOUNTER — Ambulatory Visit: Admitting: Medical-Surgical

## 2023-09-28 VITALS — BP 113/75 | HR 63 | Resp 20 | Ht 71.5 in | Wt 214.1 lb

## 2023-09-28 DIAGNOSIS — M25572 Pain in left ankle and joints of left foot: Secondary | ICD-10-CM

## 2023-09-28 MED ORDER — METHYLPREDNISOLONE ACETATE 80 MG/ML IJ SUSP
80.0000 mg | Freq: Once | INTRAMUSCULAR | Status: AC
  Administered 2023-09-28: 80 mg via INTRAMUSCULAR

## 2023-09-28 MED ORDER — PREDNISONE 50 MG PO TABS: 50.0000 mg | ORAL_TABLET | Freq: Every day | ORAL | 0 refills | Status: DC

## 2023-09-28 NOTE — Progress Notes (Signed)
        Established patient visit  History, exam, impression, and plan:  1. Acute left ankle pain (Primary) Pleasant 53 year old male presenting today with reports of approximately 4 days of left foot and ankle pain with swelling.  Notes a positive history of gout and feels that the symptoms are consistent with what he is felt before.  He has some permanent reddish-purple discoloration to the left foot and ankle.  Notes that the most uncomfortable areas in his foot are along the ball of his foot proximal to the second, third, and fourth digits.  Notes some numbness and tingling around the lateral foot.  Capillary refill less than 3 seconds to toes.  Skin cool, dry, intact.  DP and PT pulses 2+.  Range of motion to digits, forefoot, and ankle intact.  No erythema, excessive warmth, or severe pain/tenderness to the ankle or the first MTP joint.  1+ pedal edema on the left foot/ankle most notable surrounding the medial malleolus.  No recent injury.  Reports symptoms are similar to previous episodes and usually respond well to steroids.  Presentation is atypical for gout but as he has responded well in the past to steroids with similar symptoms, we will go ahead and treat with Depo-Medrol IM x 1 today followed by 5-day burst of prednisone.  Checking uric acid and CMP. - methylPREDNISolone acetate (DEPO-MEDROL) injection 80 mg - predniSONE (DELTASONE) 50 MG tablet; Take 1 tablet (50 mg total) by mouth daily with breakfast.  Dispense: 5 tablet; Refill: 0 - CMP14+EGFR - Uric acid   Procedures performed this visit: None.  Return if symptoms worsen or fail to improve.  __________________________________ Thayer Ohm, DNP, APRN, FNP-BC Primary Care and Sports Medicine [facility]

## 2023-09-29 ENCOUNTER — Encounter: Payer: Self-pay | Admitting: Medical-Surgical

## 2023-09-29 LAB — CMP14+EGFR
ALT: 31 IU/L (ref 0–44)
AST: 19 IU/L (ref 0–40)
Albumin: 4.3 g/dL (ref 3.8–4.9)
Alkaline Phosphatase: 86 IU/L (ref 44–121)
BUN/Creatinine Ratio: 14 (ref 9–20)
BUN: 13 mg/dL (ref 6–24)
Bilirubin Total: 0.3 mg/dL (ref 0.0–1.2)
CO2: 25 mmol/L (ref 20–29)
Calcium: 9.2 mg/dL (ref 8.7–10.2)
Chloride: 104 mmol/L (ref 96–106)
Creatinine, Ser: 0.93 mg/dL (ref 0.76–1.27)
Globulin, Total: 1.9 g/dL (ref 1.5–4.5)
Glucose: 95 mg/dL (ref 70–99)
Potassium: 4.8 mmol/L (ref 3.5–5.2)
Sodium: 143 mmol/L (ref 134–144)
Total Protein: 6.2 g/dL (ref 6.0–8.5)
eGFR: 98 mL/min/{1.73_m2} (ref >59)

## 2023-09-29 LAB — URIC ACID: Uric Acid: 5.2 mg/dL (ref 3.8–8.4)

## 2023-10-01 ENCOUNTER — Ambulatory Visit (INDEPENDENT_AMBULATORY_CARE_PROVIDER_SITE_OTHER): Admitting: Medical-Surgical

## 2023-10-01 ENCOUNTER — Encounter: Payer: Self-pay | Admitting: Sports Medicine

## 2023-10-01 ENCOUNTER — Encounter: Payer: Self-pay | Admitting: Medical-Surgical

## 2023-10-01 ENCOUNTER — Ambulatory Visit: Admitting: Sports Medicine

## 2023-10-01 VITALS — BP 120/74 | HR 65 | Resp 20 | Ht 71.5 in | Wt 216.1 lb

## 2023-10-01 DIAGNOSIS — G8929 Other chronic pain: Secondary | ICD-10-CM

## 2023-10-01 DIAGNOSIS — M79672 Pain in left foot: Secondary | ICD-10-CM

## 2023-10-01 DIAGNOSIS — I872 Venous insufficiency (chronic) (peripheral): Secondary | ICD-10-CM | POA: Insufficient documentation

## 2023-10-01 NOTE — Progress Notes (Signed)
    Procedures performed today:    None.  Independent interpretation of notes and tests performed by another provider:   None.  Brief History, Exam, Impression, and Recommendations:    Pain, foot, left, chronic Very pleasant and previously healthy 53 year old male, he is having increasing numbness, fullness and discomfort left foot plantar aspect 2nd through 4th metatarsal shafts. He also gets occasional discomfort back of the calf and back of the lower leg on the left side. On exam he has visible bilateral venous stasis dermatitis with hemosiderin deposits without significant swelling. Really does not have any tenderness to palpation along the plantar metatarsal heads, he does have abnormal callus 2nd through 4th metatarsal heads. I spoke were dealing with a combination of metatarsalgia +/- radicular symptoms. I think the bilateral fullness and swelling is likely related to his venous stasis. I have suggested avoidance of barefoot walking, custom molded orthotics with metatarsal pads, intrinsic home conditioning, lumbar spine conditioning. He does have a history of prostate cancer so we will also proceed with updated foot x-rays, lumbar spine x-rays and a lumbar spine MRI. Return to see me in approximately 6 weeks.   Venous stasis dermatitis of both lower extremities Mild venous stasis dermatitis of both lower extremities, I have suggested lower extremity graduated compression stockings, knee-high.     I spent 40 minutes of total time managing this patient today, this includes chart review, face to face, and non-face to face time.  This also involved disruption of existing clinic schedule.   ____________________________________________ Ihor Austin. Benjamin Stain, M.D., ABFM., CAQSM., AME. Primary Care and Sports Medicine [facility]  Adjunct Professor of Family Medicine  [place] [employer] [employer] [facility] [employer]  Restaurant manager, fast food

## 2023-10-01 NOTE — Assessment & Plan Note (Signed)
 Mild venous stasis dermatitis of both lower extremities, I have suggested lower extremity graduated compression stockings, knee-high.

## 2023-10-01 NOTE — Progress Notes (Deleted)
   Established patient visit  History, exam, impression, and plan:  1. Cough, unspecified type 2. Sore throat Justin Norris 53 year old male presenting today with reports of upper respiratory symptoms.  Notes that this started approximately 5 days ago with her nose and eyes burning.  On Saturday, she felt unwell and by Sunday she notes that she felt awful.  Her throat has been sore and she has been coughing.  Notes that her cough has been occasionally productive of small amounts of pink-tinged sputum, usually in the morning.  Her left ear has now developed pressure/discomfort and she has significant sinus congestion.  She has tried drinking hot tea and increasing her fluid consumption.  She has also used Chloraseptic for the sore throat.  Continues to have significant issues with hoarseness.  She saw ENT and they recommended just using Atrovent nasal spray twice daily and follow-up with them after a couple of months.  POCT strep, flu, and COVID testing all negative today.  Below for physical exam. - POCT rapid strep A - POCT Influenza A/B - POC COVID-19  3. Viral URI with cough Despite negative testing, suspect that her symptoms are truly related to a viral URI.  Discussed the timeline for resolution of a viral illness since symptoms can last 7 to 14 days.  With her severe hoarseness and significant sinus congestion, treating with Decadron 4 mg twice daily.  Adding Tussionex for cough suppression.  Okay to use Tylenol/ibuprofen for fever/discomfort.  Continue conservative measures at home.  If improvement in symptoms not noted over the next 2 to 3 days or symptoms improved but quickly worsen again, consider adding antibiotic therapy for secondary bacterial infection.   Procedures performed this visit: None.  Return if symptoms worsen or fail to improve.  __________________________________ Thayer Ohm, DNP, APRN, FNP-BC Primary Care and Sports Medicine [facility] [place]

## 2023-10-01 NOTE — Addendum Note (Signed)
 Addended by: Monica Becton on: 10/01/2023 12:03 PM   Modules accepted: Level of Service

## 2023-10-01 NOTE — Assessment & Plan Note (Signed)
 Very pleasant and previously healthy 53 year old male, he is having increasing numbness, fullness and discomfort left foot plantar aspect 2nd through 4th metatarsal shafts. He also gets occasional discomfort back of the calf and back of the lower leg on the left side. On exam he has visible bilateral venous stasis dermatitis with hemosiderin deposits without significant swelling. Really does not have any tenderness to palpation along the plantar metatarsal heads, he does have abnormal callus 2nd through 4th metatarsal heads. I spoke were dealing with a combination of metatarsalgia +/- radicular symptoms. I think the bilateral fullness and swelling is likely related to his venous stasis. I have suggested avoidance of barefoot walking, custom molded orthotics with metatarsal pads, intrinsic home conditioning, lumbar spine conditioning. He does have a history of prostate cancer so we will also proceed with updated foot x-rays, lumbar spine x-rays and a lumbar spine MRI. Return to see me in approximately 6 weeks.

## 2023-10-21 ENCOUNTER — Encounter: Payer: Self-pay | Admitting: Urology

## 2023-10-21 ENCOUNTER — Ambulatory Visit: Payer: Managed Care, Other (non HMO) | Admitting: Urology

## 2023-10-21 VITALS — BP 118/81 | HR 68 | Ht 71.0 in | Wt 210.0 lb

## 2023-10-21 DIAGNOSIS — C61 Malignant neoplasm of prostate: Secondary | ICD-10-CM | POA: Diagnosis not present

## 2023-10-21 LAB — URINALYSIS, ROUTINE W REFLEX MICROSCOPIC
Bilirubin, UA: NEGATIVE
Glucose, UA: NEGATIVE
Ketones, UA: NEGATIVE
Leukocytes,UA: NEGATIVE
Nitrite, UA: NEGATIVE
Protein,UA: NEGATIVE
RBC, UA: NEGATIVE
Specific Gravity, UA: 1.015 (ref 1.005–1.030)
Urobilinogen, Ur: 0.2 mg/dL (ref 0.2–1.0)
pH, UA: 6.5 (ref 5.0–7.5)

## 2023-10-21 NOTE — Progress Notes (Signed)
 Assessment: 1. Prostate cancer (HCC); PSA 7.48; GG 1,2; unfavorable intermediate risk (>50% cores +)    Plan: PSA today Continue tamsulosin 0.4 mg BID Return to office in 3 months with PSA  I personally spent 30 minutes involved in face to face and non-face-to-face activities for this patient on the day of the visit.  Professional time spent included the following activities, in addition to those noted in the documentation: Answering a number of questions regarding his treatment, current symptoms in relation to his prostate cancer diagnosis, and expectations moving forward.   Chief Complaint:  Chief Complaint  Patient presents with   Prostate Cancer    History of Present Illness:  Justin Norris is a 53 y.o. male who is seen for follow-up of unfavorable intermediate risk prostate cancer status post brachytherapy in November 2024.  He was found to have an elevated PSA. PSA results: 2/24 6.76 5/24 7.48  No history of UTIs or prostatitis.  His father was diagnosed with prostate cancer in his 4s and was treated with brachytherapy.  He was diagnosed with low testosterone and has been treated with testosterone replacement since February 2024.  He has been using testosterone injections every 2 weeks.  He is also using tadalafil 20 mg as needed for erectile dysfunction.  He reports improvement in his libido and in his erectile function with testosterone replacement.  He has a history of a left undescended testicle undergoing left orchiopexy in 1977.  He underwent transrectal ultrasound and biopsy of the prostate on 01/13/23. TRUS volume:  27 ml  PSA density:  0.27 Biopsy results:             Gleason score: 3+ 4 = 7, 3 + 3 = 6            # positive cores: 3/6 on right    5/6 on left            Location of cancer: apex, mid gland, and base  Complications after biopsy: none Decipher: Low risk PSMA PET scan negative for metastatic disease.  He underwent low-dose rate brachytherapy in  November 2024.  He returns today for follow-up.  He is having lower urinary tract symptoms including frequency, urgency, nocturia x 2, decreased stream, and straining.  No dysuria or gross hematuria. He continues on tamsulosin 0.8 mg daily. IPSS = 18/3 today.  Portions of the above documentation were copied from a prior visit for review purposes only.   Past Medical History:  Past Medical History:  Diagnosis Date   ANXIETY 07/15/2007   BACK PAIN, RIGHT 07/10/2010   Cancer (HCC)    Headache(784.0) 07/15/2007   History of kidney stones    NEPHROLITHIASIS, HX OF 07/15/2007    Past Surgical History:  Past Surgical History:  Procedure Laterality Date   CYSTOSCOPY N/A 06/01/2023   Procedure: CYSTOSCOPY;  Surgeon: [name], MD;  Location: [facility];  Service: Urology;  Laterality: N/A;   HERNIA REPAIR     LIH   PROSTATE BIOPSY     RADIOACTIVE SEED IMPLANT Bilateral 06/01/2023   Procedure: RADIOACTIVE SEED IMPLANT/BRACHYTHERAPY IMPLANT;  Surgeon: [name], MD;  Location: [facility];  Service: Urology;  Laterality: Bilateral;   SPACE OAR INSTILLATION Bilateral 06/01/2023   Procedure: SPACE OAR INSTILLATION;  Surgeon: [name], MD;  Location: [facility];  Service: Urology;  Laterality: Bilateral;    Allergies:  Allergies  Allergen Reactions   Coconut Fatty Acid Anaphylaxis  Family History:  Family History  Problem Relation Age of Onset   Heart disease Father    Skin cancer Father     Social History:  Social History   Tobacco Use   Smoking status: Former    Current packs/day: 0.00    Types: Cigarettes    Start date: 07/03/1994    Quit date: 07/03/2014    Years since quitting: 9.3    Passive exposure: Never   Smokeless tobacco: Never  Vaping Use   Vaping status: Never Used  Substance Use Topics   Alcohol use: No    Alcohol/week: 0.0 standard drinks of alcohol    Comment: rare   Drug use: No     ROS: Constitutional:  Negative for fever, chills, weight loss CV: Negative for chest pain, previous MI, hypertension Respiratory:  Negative for shortness of breath, wheezing, sleep apnea, frequent cough GI:  Negative for nausea, vomiting, bloody stool, GERD  Physical exam: BP 118/81   Pulse 68   Ht 5\' 11"  (1.803 m)   Wt 210 lb (95.3 kg)   BMI 29.29 kg/m  GENERAL APPEARANCE:  Well appearing, well developed, well nourished, NAD HEENT:  Atraumatic, normocephalic, oropharynx clear NECK:  Supple without lymphadenopathy or thyromegaly ABDOMEN:  Soft, non-tender, no masses EXTREMITIES:  Moves all extremities well, without clubbing, cyanosis, or edema NEUROLOGIC:  Alert and oriented x 3, normal gait, CN II-XII grossly intact MENTAL STATUS:  appropriate BACK:  Non-tender to palpation, No CVAT SKIN:  Warm, dry, and intact  Results: U/A: Negative

## 2023-10-22 LAB — PSA: Prostate Specific Ag, Serum: 5.1 ng/mL — ABNORMAL HIGH (ref 0.0–4.0)

## 2023-10-25 ENCOUNTER — Encounter: Payer: Self-pay | Admitting: Urology

## 2023-10-26 ENCOUNTER — Other Ambulatory Visit: Payer: Self-pay | Admitting: Urology

## 2023-10-26 MED ORDER — LEVOFLOXACIN 500 MG PO TABS
500.0000 mg | ORAL_TABLET | Freq: Every day | ORAL | 0 refills | Status: DC
Start: 2023-10-26 — End: 2023-11-19

## 2023-10-28 ENCOUNTER — Ambulatory Visit: Payer: Self-pay

## 2023-10-28 NOTE — Telephone Encounter (Signed)
  Chief Complaint: high blood reading Symptoms: no symptoms  Frequency: in dental office today Pertinent Negatives: Patient denies chest pain, headache, numbness, tingling Disposition: [] ED /[] Urgent Care (no appt availability in office) / [x] Appointment(In office/virtual)/ []  [place] [facility]/ [] Home Care/ [] Refused Recommended Disposition /[] [place] [facility]/ []  Follow-up with PCP Additional Notes:  He was noted to have elevated blood pressure reading and has been monitoring blood pressure intermittently since summer 2024. He is requesting an office visit to evaluate his continued elevated blood pressures. He notes he has been under additional stress and worried about his prostate cancer, had surgery in November. Today while at dental appointment his blood pressure was 151/108, 67 bpm. He reports it was also elevated last week at urology office, does not recall the reading. Currently asymptomatic. Does report he occasionally has headaches but unsure if this is related to BP, grandchildren running around, seasonal allergies, dental issues, or a combination of them all. Acute visit scheduled with PCP on 10/29/23. Educated on care advice as documented in protocol, patient verbalized understanding. Discussed reasons to call back or call for EMS.     Copied from CRM [id number]. Topic: Clinical - Red Word Triage >> Oct 28, 2023 11:40 AM Justin Norris wrote: [facility] that prompted transfer to Nurse Triage: Patient states he has been having some elevated BP readings. The last reading being 151/108 67 BPM for his heart rate was read Reason for Disposition  [1] Systolic BP  >= 130 OR Diastolic >= 80 AND [2] not taking BP medications  Protocols used: Blood Pressure - High-A-AH

## 2023-10-29 ENCOUNTER — Encounter: Payer: Self-pay | Admitting: Family Medicine

## 2023-10-29 ENCOUNTER — Ambulatory Visit: Admitting: Family Medicine

## 2023-10-29 VITALS — BP 125/81 | HR 76 | Ht 71.0 in | Wt 201.0 lb

## 2023-10-29 DIAGNOSIS — C61 Malignant neoplasm of prostate: Secondary | ICD-10-CM

## 2023-10-29 DIAGNOSIS — I10 Essential (primary) hypertension: Secondary | ICD-10-CM | POA: Diagnosis not present

## 2023-10-29 DIAGNOSIS — F411 Generalized anxiety disorder: Secondary | ICD-10-CM

## 2023-10-29 MED ORDER — AMLODIPINE BESYLATE 2.5 MG PO TABS: 2.5000 mg | ORAL_TABLET | Freq: Every day | ORAL | 1 refills | Status: DC

## 2023-10-29 NOTE — Progress Notes (Signed)
 Justin Norris - 53 y.o. male MRN [id number]  Date of birth: 01/05/1971  Subjective Chief Complaint  Patient presents with   Anxiety   Fear   Stress   Hypertension    HPI Justin Norris is a 53 y.o. male here today with concerns of elevated blood pressure.   He reports having elevated BP readings at home.  Some stress related to things at home as well as management of prostate cancer.  His father recently passed away as well which has been very stressful for him.  He did have recent visit with his urologist.  He has experienced some discomfort in the perineal area.  He reports that his urologist mentioned possibility of prostatitis.  He is not currently on any treatment for this.  PSA was decreased some from previous reading.  He does report that he feels that his relationship with his urologist is strained and requests referral to another urologist for second opinion regarding continued prostate cancer care.  He denies any symptoms related to hypertension including chest pain, shortness of breath, palpitations, headaches or vision changes.  ROS:  A comprehensive ROS was completed and negative except as noted per HPI  Allergies  Allergen Reactions   Coconut Fatty Acid Anaphylaxis    Past Medical History:  Diagnosis Date   ANXIETY 07/15/2007   BACK PAIN, RIGHT 07/10/2010   Cancer (HCC)    Headache(784.0) 07/15/2007   History of kidney stones    NEPHROLITHIASIS, HX OF 07/15/2007    Past Surgical History:  Procedure Laterality Date   CYSTOSCOPY N/A 06/01/2023   Procedure: CYSTOSCOPY;  Surgeon: Scarlet Curly, MD;  Location: [facility];  Service: Urology;  Laterality: N/A;   HERNIA REPAIR     LIH   PROSTATE BIOPSY     RADIOACTIVE SEED IMPLANT Bilateral 06/01/2023   Procedure: RADIOACTIVE SEED IMPLANT/BRACHYTHERAPY IMPLANT;  Surgeon: Scarlet Curly, MD;  [facility]  Service: Urology;  Laterality: Bilateral;   SPACE OAR INSTILLATION  Bilateral 06/01/2023   Procedure: SPACE OAR INSTILLATION;  Surgeon: Scarlet Curly, MD;  Location: [facility];  Service: Urology;  Laterality: Bilateral;    Social History   Socioeconomic History   Marital status: Married    Spouse name: Not on file   Number of children: 9   Years of education: Not on file   Highest education level: Bachelor's degree (e.g., BA, AB, BS)  Occupational History   Occupation: Environmental health practitioner: [employer]  Tobacco Use   Smoking status: Former    Current packs/day: 0.00    Types: Cigarettes    Start date: 07/03/1994    Quit date: 07/03/2014    Years since quitting: 9.3    Passive exposure: Never   Smokeless tobacco: Never  Vaping Use   Vaping status: Never Used  Substance and Sexual Activity   Alcohol use: No    Alcohol/week: 0.0 standard drinks of alcohol    Comment: rare   Drug use: No   Sexual activity: Yes    Partners: Female  Other Topics Concern   Not on file  Social History Narrative   Not on file   Social Drivers of Health   Financial Resource Strain: Low Risk  (07/06/2023)   Overall Financial Resource Strain (CARDIA)    Difficulty of Paying Living Expenses: Not hard at all  Food Insecurity: No Food Insecurity (07/06/2023)   Hunger Vital Sign    Worried About Running  Out of Food in the Last Year: Never true    Ran Out of Food in the Last Year: Never true  Transportation Needs: No Transportation Needs (07/06/2023)   PRAPARE - Administrator, Civil Service (Medical): No    Lack of Transportation (Non-Medical): No  Physical Activity: Sufficiently Active (07/06/2023)   Exercise Vital Sign    Days of Exercise per Week: 5 days    Minutes of Exercise per Session: 30 min  Stress: No Stress Concern Present (07/06/2023)   [employer] of Occupational Health - Occupational Stress Questionnaire    Feeling of Stress : Only a little  Social Connections: Socially Integrated (07/06/2023)    Social Connection and Isolation Panel [NHANES]    Frequency of Communication with Friends and Family: More than three times a week    Frequency of Social Gatherings with Friends and Family: More than three times a week    Attends Religious Services: More than 4 times per year    Active Member of [employer] or Organizations: Yes    Attends Engineer, structural: More than 4 times per year    Marital Status: Married    Family History  Problem Relation Age of Onset   Heart disease Father    Skin cancer Father     Health Maintenance  Topic Date Due   HIV Screening  Never done   Hepatitis C Screening  Never done   Pneumococcal Vaccine 84-28 Years old (1 of 2 - PCV) Never done   Colonoscopy  Never done   Zoster Vaccines- Shingrix (2 of 2) 02/01/2023   COVID-19 Vaccine (6 - 2024-25 season) 03/21/2023   INFLUENZA VACCINE  02/18/2024   DTaP/Tdap/Td (2 - Td or Tdap) 12/06/2032   HPV VACCINES  Aged Out   Meningococcal B Vaccine  Aged Out     ----------------------------------------------------------------------------------------------------------------------------------------------------------------------------------------------------------------- Physical Exam BP 125/81 (BP Location: Left Arm, Patient Position: Sitting, Cuff Size: Normal)   Pulse 76   Ht 5\' 11"  (1.803 m)   Wt 201 lb (91.2 kg)   SpO2 97%   BMI 28.03 kg/m   Physical Exam Constitutional:      Appearance: Normal appearance.  HENT:     Head: Normocephalic and atraumatic.  Eyes:     General: No scleral icterus. Cardiovascular:     Rate and Rhythm: Normal rate and regular rhythm.  Pulmonary:     Effort: Pulmonary effort is normal.     Breath sounds: Normal breath sounds.  Neurological:     Mental Status: He is alert.  Psychiatric:        Mood and Affect: Mood normal.        Behavior: Behavior normal.      ------------------------------------------------------------------------------------------------------------------------------------------------------------------------------------------------------------------- Assessment and Plan  Essential hypertension Blood pressure readings elevated at home as well as some readings here in the clinic.  Better controlled here today.  Will add amlodipine 2.5 mg daily.  Return in about 2 weeks for blood pressure recheck.  Anxiety state Increased anxiety due to the loss of his father recently as well as his continued worry about his prostate cancer.  Using alprazolam sparingly as needed.  Does not want to add anything additional at this time.  Prostate cancer (HCC); PSA 7.48; GG 1,2; unfavorable intermediate risk (>50% cores +) Status post brachytherapy.  PSA levels are trending down some on last check.  He feels that relationship with his current urologist is somewhat strained and would like referral to urologist for ongoing care as well  as second opinion regarding whether he is on the right track.   Meds ordered this encounter  Medications   amLODipine (NORVASC) 2.5 MG tablet    Sig: Take 1 tablet (2.5 mg total) by mouth daily.    Dispense:  90 tablet    Refill:  1    Return in about 3 weeks (around 11/19/2023) for Hypertension-40 min.

## 2023-10-31 NOTE — Assessment & Plan Note (Signed)
 Increased anxiety due to the loss of his father recently as well as his continued worry about his prostate cancer.  Using alprazolam sparingly as needed.  Does not want to add anything additional at this time.

## 2023-10-31 NOTE — Assessment & Plan Note (Signed)
 Blood pressure readings elevated at home as well as some readings here in the clinic.  Better controlled here today.  Will add amlodipine 2.5 mg daily.  Return in about 2 weeks for blood pressure recheck.

## 2023-10-31 NOTE — Assessment & Plan Note (Signed)
 Status post brachytherapy.  PSA levels are trending down some on last check.  He feels that relationship with his current urologist is somewhat strained and would like referral to urologist for ongoing care as well as second opinion regarding whether he is on the right track.

## 2023-11-19 ENCOUNTER — Encounter: Payer: Self-pay | Admitting: Family Medicine

## 2023-11-19 ENCOUNTER — Ambulatory Visit: Admitting: Family Medicine

## 2023-11-19 VITALS — BP 113/79 | HR 81 | Ht 71.0 in | Wt 208.0 lb

## 2023-11-19 DIAGNOSIS — I10 Essential (primary) hypertension: Secondary | ICD-10-CM

## 2023-11-19 DIAGNOSIS — C61 Malignant neoplasm of prostate: Secondary | ICD-10-CM | POA: Diagnosis not present

## 2023-11-19 DIAGNOSIS — N419 Inflammatory disease of prostate, unspecified: Secondary | ICD-10-CM | POA: Diagnosis not present

## 2023-11-19 MED ORDER — LEVOFLOXACIN 500 MG PO TABS: 500.0000 mg | ORAL_TABLET | Freq: Every day | ORAL | 0 refills | Status: AC

## 2023-11-19 NOTE — Assessment & Plan Note (Signed)
 Rechecking PSA and will treat with additional 14 days of levaquin .  He is establishing with a new urologist closer to home as well.

## 2023-11-19 NOTE — Progress Notes (Signed)
 Justin Norris - 53 y.o. male MRN [id number]  Date of birth: March 21, 1971  Subjective Chief Complaint  Patient presents with   Medical Management of Chronic Issues    3 week fup on htn    HPI Justin Norris is a 53 y.o. male here today for follow up.   BP elevated at last visit.  He was under more emotional stress at that time as his father has just passed away.  Amlodipine  2.5mg  sent in at last visit.  BP looks much better today.  Denies side effects from medication.  He has not had chest pain, shortness of breath, palpitations, headache or vision changes.    Treated for prostatitis by urology with course of levaquin .  He felt much better while taking this but now feels like his prostate is more swollen again.  He would like an additional course of levaquin .  He has not had any noticeable side effects from this so far.   ROS:  A comprehensive ROS was completed and negative except as noted per HPI  Allergies  Allergen Reactions   Coconut Fatty Acid Anaphylaxis    Past Medical History:  Diagnosis Date   ANXIETY 07/15/2007   BACK PAIN, RIGHT 07/10/2010   Cancer (HCC)    Headache(784.0) 07/15/2007   History of kidney stones    NEPHROLITHIASIS, HX OF 07/15/2007    Past Surgical History:  Procedure Laterality Date   CYSTOSCOPY N/A 06/01/2023   Procedure: CYSTOSCOPY;  Surgeon: Scarlet Curly, MD;  Location: [facility];  Service: Urology;  Laterality: N/A;   HERNIA REPAIR     LIH   PROSTATE BIOPSY     RADIOACTIVE SEED IMPLANT Bilateral 06/01/2023   Procedure: RADIOACTIVE SEED IMPLANT/BRACHYTHERAPY IMPLANT;  Surgeon: Scarlet Curly, MD;  Location: [facility];  Service: Urology;  Laterality: Bilateral;   SPACE OAR INSTILLATION Bilateral 06/01/2023   Procedure: SPACE OAR INSTILLATION;  Surgeon: Scarlet Curly, MD;  Location: [facility];  Service: Urology;  Laterality: Bilateral;    Social History   Socioeconomic History    Marital status: Married    Spouse name: Not on file   Number of children: 9   Years of education: Not on file   Highest education level: Bachelor's degree (e.g., BA, AB, BS)  Occupational History   Occupation: Environmental health practitioner: [employer]  Tobacco Use   Smoking status: Former    Current packs/day: 0.00    Types: Cigarettes    Start date: 07/03/1994    Quit date: 07/03/2014    Years since quitting: 9.3    Passive exposure: Never   Smokeless tobacco: Never  Vaping Use   Vaping status: Never Used  Substance and Sexual Activity   Alcohol use: No    Alcohol/week: 0.0 standard drinks of alcohol    Comment: rare   Drug use: No   Sexual activity: Yes    Partners: Female  Other Topics Concern   Not on file  Social History Narrative   Not on file   Social Drivers of Health   Financial Resource Strain: Low Risk  (07/06/2023)   Overall Financial Resource Strain (CARDIA)    Difficulty of Paying Living Expenses: Not hard at all  Food Insecurity: No Food Insecurity (07/06/2023)   Hunger Vital Sign    Worried About Running Out of Food in the Last Year: Never true    Ran Out of Food in the Last Year: Never true  Transportation Needs: No Transportation Needs (07/06/2023)   PRAPARE - Administrator, Civil Service (Medical): No    Lack of Transportation (Non-Medical): No  Physical Activity: Sufficiently Active (07/06/2023)   Exercise Vital Sign    Days of Exercise per Week: 5 days    Minutes of Exercise per Session: 30 min  Stress: No Stress Concern Present (07/06/2023)   [employer] of Occupational Health - Occupational Stress Questionnaire    Feeling of Stress : Only a little  Social Connections: Socially Integrated (07/06/2023)   Social Connection and Isolation Panel [NHANES]    Frequency of Communication with Friends and Family: More than three times a week    Frequency of Social Gatherings with Friends and Family: More than three times a week     Attends Religious Services: More than 4 times per year    Active Member of Clubs or Organizations: Yes    Attends Engineer, structural: More than 4 times per year    Marital Status: Married    Family History  Problem Relation Age of Onset   Heart disease Father    Skin cancer Father     Health Maintenance  Topic Date Due   HIV Screening  Never done   Hepatitis C Screening  Never done   Pneumococcal Vaccine 34-69 Years old (1 of 2 - PCV) Never done   Colonoscopy  Never done   Zoster Vaccines- Shingrix  (2 of 2) 02/01/2023   COVID-19 Vaccine (6 - 2024-25 season) 03/21/2023   INFLUENZA VACCINE  02/18/2024   DTaP/Tdap/Td (2 - Td or Tdap) 12/06/2032   HPV VACCINES  Aged Out   Meningococcal B Vaccine  Aged Out     ----------------------------------------------------------------------------------------------------------------------------------------------------------------------------------------------------------------- Physical Exam BP 113/79   Pulse 81   Ht 5\' 11"  (1.803 m)   Wt 208 lb (94.3 kg)   SpO2 99%   BMI 29.01 kg/m   Physical Exam Constitutional:      Appearance: Normal appearance.  Eyes:     General: No scleral icterus. Cardiovascular:     Rate and Rhythm: Normal rate and regular rhythm.  Pulmonary:     Effort: Pulmonary effort is normal.     Breath sounds: Normal breath sounds.  Musculoskeletal:     Cervical back: Neck supple.  Neurological:     Mental Status: He is alert.  Psychiatric:        Mood and Affect: Mood normal.        Behavior: Behavior normal.     ------------------------------------------------------------------------------------------------------------------------------------------------------------------------------------------------------------------- Assessment and Plan  Essential hypertension BP is well controlled with amlodipine  at current strength.  Will plan to continue.   Prostatitis Rechecking PSA and will treat  with additional 14 days of levaquin .  He is establishing with a new urologist closer to home as well.    Meds ordered this encounter  Medications   levofloxacin  (LEVAQUIN ) 500 MG tablet    Sig: Take 1 tablet (500 mg total) by mouth daily for 14 days.    Dispense:  14 tablet    Refill:  0    Return in about 4 months (around 03/21/2024) for Hypertension, prostate cancer-Please make 40 minute patient. Aaron Aas

## 2023-11-19 NOTE — Assessment & Plan Note (Signed)
 BP is well controlled with amlodipine  at current strength.  Will plan to continue.

## 2023-11-20 LAB — PSA: Prostate Specific Ag, Serum: 3 ng/mL (ref 0.0–4.0)

## 2023-11-24 ENCOUNTER — Encounter: Payer: Self-pay | Admitting: Family Medicine

## 2023-11-29 ENCOUNTER — Telehealth: Payer: Self-pay

## 2023-11-29 NOTE — Telephone Encounter (Signed)
 Patient had attempted to call us  back but call was disconnected.  Called patient and left a detailed voice mail message on listed mobile # ( allowed on DPR)

## 2023-11-29 NOTE — Telephone Encounter (Signed)
 Attempted to call patient back - left a detailed voice mail message on patient home # ( allowed on DPR )

## 2023-11-29 NOTE — Telephone Encounter (Signed)
 Copied from CRM [id number]. Topic: Clinical - Lab/Test Results >> Nov 29, 2023 10:05 AM Arlie Benedict B wrote: Reason for CRM: Patient stated he was returning call he had received from the clinic in regards to his labs. Spoke with Ms. Devra Fontana she stated she was going to get some assistance for the patient. Assistance came to the phone to assist the patient but he had already disconnected call.

## 2023-12-16 ENCOUNTER — Ambulatory Visit: Payer: Self-pay

## 2023-12-16 NOTE — Telephone Encounter (Signed)
  Chief Complaint: red, swollen, hard nose Symptoms: blood crusted nose, pain,  Frequency: started yesterday.  Pertinent Negatives: Patient denies SOB, difficulty breathing, sore throat, fever Disposition: [] ED /[] Urgent Care (no appt availability in office) / [x] Appointment(In office/virtual)/ []  [facility]/ [] Home Care/ [] Refused Recommended Disposition /[] [facility]/ []  Follow-up with PCP Additional Notes: Pt states that his nose is red and swollen. Pt states sinus pain. Nose is blocked. Pt states that he has been doing saline in the nose. Pt scheduled tomorrow.   Copied from CRM [id number]. Topic: Clinical - Red Word Triage >> Dec 16, 2023  8:09 AM [name] wrote: Red Word that prompted transfer to Nurse Triage: Nose swelling hard and red/caked with blood/rash underneath right armpit Reason for Disposition  [1] Using nasal washes and pain medicine > 24 hours AND [2] sinus pain (around cheekbone or eye) persists  Answer Assessment - Initial Assessment Questions 1. LOCATION: "Where does it hurt?"      Nose and face 2. ONSET: "When did the sinus pain start?"  (e.g., hours, days)  yesterday 3. SEVERITY: "How bad is the pain?"   (Scale 1-10; mild, moderate or severe)   - MILD (1-3): doesn't interfere with normal activities    - MODERATE (4-7): interferes with normal activities (e.g., work or school) or awakens from sleep   - SEVERE (8-10): excruciating pain and patient unable to do any normal activities        Agitated, but 3-4 4. RECURRENT SYMPTOM: "Have you ever had sinus problems before?" If Yes, ask: "When was the last time?" and "What happened that time?"      probably 5. NASAL CONGESTION: "Is the nose blocked?" If Yes, ask: "Can you open it or must you breathe through your mouth?"     Nose is blocked 6. NASAL DISCHARGE: "Do you have discharge from your nose?" If so ask, "What color?"     Blood and discharge 7. FEVER: "Do you have a fever?" If Yes, ask: "What is  it, how was it measured, and when did it start?"      Does not feel as though he has 8. OTHER SYMPTOMS: "Do you have any other symptoms?" (e.g., sore throat, cough, earache, difficulty breathing)     Swollen, hard, red nose  Protocols used: Sinus Pain or Congestion-A-AH

## 2023-12-17 ENCOUNTER — Encounter: Payer: Self-pay | Admitting: Family Medicine

## 2023-12-17 ENCOUNTER — Ambulatory Visit: Admitting: Family Medicine

## 2023-12-17 VITALS — BP 107/75 | HR 74 | Temp 98.4°F | Ht 71.0 in | Wt 200.0 lb

## 2023-12-17 DIAGNOSIS — L239 Allergic contact dermatitis, unspecified cause: Secondary | ICD-10-CM | POA: Diagnosis not present

## 2023-12-17 DIAGNOSIS — L731 Pseudofolliculitis barbae: Secondary | ICD-10-CM | POA: Diagnosis not present

## 2023-12-17 DIAGNOSIS — J0100 Acute maxillary sinusitis, unspecified: Secondary | ICD-10-CM | POA: Insufficient documentation

## 2023-12-17 DIAGNOSIS — L259 Unspecified contact dermatitis, unspecified cause: Secondary | ICD-10-CM | POA: Insufficient documentation

## 2023-12-17 MED ORDER — PREDNISONE 10 MG (48) PO TBPK: ORAL_TABLET | Freq: Every day | ORAL | 0 refills | Status: DC

## 2023-12-17 MED ORDER — DOXYCYCLINE HYCLATE 100 MG PO TABS: 100.0000 mg | ORAL_TABLET | Freq: Two times a day (BID) | ORAL | 0 refills | Status: AC

## 2023-12-17 MED ORDER — ALPRAZOLAM 0.5 MG PO TABS: 0.5000 mg | ORAL_TABLET | Freq: Two times a day (BID) | ORAL | 1 refills | Status: AC | PRN

## 2023-12-17 NOTE — Assessment & Plan Note (Signed)
 No significant fluctuance noted.  He can try warm compresses on the area.  Doxycycline  should help with this as well.

## 2023-12-17 NOTE — Assessment & Plan Note (Signed)
 Rash consistent with contact dermatitis.  Taper steroids should be helpful for this.  Discussed signs and symptoms of infection.

## 2023-12-17 NOTE — Progress Notes (Signed)
 Justin Norris - 53 y.o. male MRN [id number]  Date of birth: October 10, 1970  Subjective Chief Complaint  Patient presents with   Facial Pain    HPI Justin Norris is a 53 year old male here today for acute visit.  He has complaint of sinus pain and pressure with thick yellow mucus, left ear pressure and mild headache.  He has had symptoms for about a week.  He denies cough, wheezing, shortness of breath or fever.  He also has firm erythematous area in the groin area.  This has been present about a week.  Denies drainage from the area.  Has not tried anything on this so far.  He also has rash on his right side and feet.  He has been working outdoors and gardening more recently.  Some of these areas are itchy.  ROS:  A comprehensive ROS was completed and negative except as noted per HPI  Allergies  Allergen Reactions   Coconut Fatty Acid Anaphylaxis    Past Medical History:  Diagnosis Date   ANXIETY 07/15/2007   BACK PAIN, RIGHT 07/10/2010   Cancer (HCC)    Headache(784.0) 07/15/2007   History of kidney stones    NEPHROLITHIASIS, HX OF 07/15/2007    Past Surgical History:  Procedure Laterality Date   CYSTOSCOPY N/A 06/01/2023   Procedure: CYSTOSCOPY;  Surgeon: Scarlet Curly, MD;  Location: [facility];  Service: Urology;  Laterality: N/A;   HERNIA REPAIR     LIH   PROSTATE BIOPSY     RADIOACTIVE SEED IMPLANT Bilateral 06/01/2023   Procedure: RADIOACTIVE SEED IMPLANT/BRACHYTHERAPY IMPLANT;  Surgeon: Scarlet Curly, MD;  Location: [facility];  Service: Urology;  Laterality: Bilateral;   SPACE OAR INSTILLATION Bilateral 06/01/2023   Procedure: SPACE OAR INSTILLATION;  Surgeon: Scarlet Curly, MD;  Location: [facility];  Service: Urology;  Laterality: Bilateral;    Social History   Socioeconomic History   Marital status: Married    Spouse name: Not on file   Number of children: 9   Years of education: Not on file   Highest  education level: Bachelor's degree (e.g., BA, AB, BS)  Occupational History   Occupation: Environmental health practitioner: [employer]  Tobacco Use   Smoking status: Former    Current packs/day: 0.00    Types: Cigarettes    Start date: 07/03/1994    Quit date: 07/03/2014    Years since quitting: 9.4    Passive exposure: Never   Smokeless tobacco: Never  Vaping Use   Vaping status: Never Used  Substance and Sexual Activity   Alcohol use: No    Alcohol/week: 0.0 standard drinks of alcohol    Comment: rare   Drug use: No   Sexual activity: Yes    Partners: Female  Other Topics Concern   Not on file  Social History Narrative   Not on file   Social Drivers of Health   Financial Resource Strain: Low Risk  (07/06/2023)   Overall Financial Resource Strain (CARDIA)    Difficulty of Paying Living Expenses: Not hard at all  Food Insecurity: No Food Insecurity (07/06/2023)   Hunger Vital Sign    Worried About Running Out of Food in the Last Year: Never true    Ran Out of Food in the Last Year: Never true  Transportation Needs: No Transportation Needs (07/06/2023)   PRAPARE - Transportation    Lack of Transportation (Medical): No    Lack of  Transportation (Non-Medical): No  Physical Activity: Sufficiently Active (07/06/2023)   Exercise Vital Sign    Days of Exercise per Week: 5 days    Minutes of Exercise per Session: 30 min  Stress: No Stress Concern Present (07/06/2023)   [employer] of Occupational Health - Occupational Stress Questionnaire    Feeling of Stress : Only a little  Social Connections: Socially Integrated (07/06/2023)   Social Connection and Isolation Panel [NHANES]    Frequency of Communication with Friends and Family: More than three times a week    Frequency of Social Gatherings with Friends and Family: More than three times a week    Attends Religious Services: More than 4 times per year    Active Member of [employer] or Organizations: Yes    Attends Probation officer: More than 4 times per year    Marital Status: Married    Family History  Problem Relation Age of Onset   Heart disease Father    Skin cancer Father     Health Maintenance  Topic Date Due   HIV Screening  Never done   Hepatitis C Screening  Never done   Pneumococcal Vaccine 26-57 Years old (1 of 2 - PCV) Never done   Colonoscopy  Never done   Zoster Vaccines- Shingrix  (2 of 2) 02/01/2023   COVID-19 Vaccine (6 - 2024-25 season) 03/21/2023   INFLUENZA VACCINE  02/18/2024   DTaP/Tdap/Td (2 - Td or Tdap) 12/06/2032   HPV VACCINES  Aged Out   Meningococcal B Vaccine  Aged Out     ----------------------------------------------------------------------------------------------------------------------------------------------------------------------------------------------------------------- Physical Exam BP 107/75   Pulse 74   Temp 98.4 F (36.9 C) (Oral)   Ht 5\' 11"  (1.803 m)   Wt 200 lb 0.6 oz (90.7 kg)   SpO2 95%   BMI 27.90 kg/m   Physical Exam Constitutional:      Appearance: Normal appearance.  HENT:     Head: Normocephalic and atraumatic.     Right Ear: Tympanic membrane and ear canal normal.     Left Ear: Ear canal normal.     Ears:     Comments: Left TM with air-fluid level.    Nose:     Comments: Tenderness to palpation across bilateral maxillary sinuses. Eyes:     General: No scleral icterus. Cardiovascular:     Rate and Rhythm: Normal rate and regular rhythm.  Pulmonary:     Effort: Pulmonary effort is normal.     Breath sounds: Normal breath sounds.  Musculoskeletal:     Cervical back: Neck supple.     Comments: Papular rash along the right flank as well as across bilateral feet.  There is mild erythema without significant tenderness or warmth.  Indurated area with appearance of ingrown hair in the upper pelvic area.  No drainage or fluctuation noted.  Neurological:     Mental Status: He is alert.      ------------------------------------------------------------------------------------------------------------------------------------------------------------------------------------------------------------------- Assessment and Plan  Acute maxillary sinusitis Will treat with course of doxycycline .  Also adding prednisone  as he seems to have some eustachian tube dysfunction this may help with sinus inflammation.  Recommend supportive care at home with good hydration.  Ingrown hair No significant fluctuance noted.  He can try warm compresses on the area.  Doxycycline  should help with this as well.  Contact dermatitis Rash consistent with contact dermatitis.  Taper steroids should be helpful for this.  Discussed signs and symptoms of infection.   Meds ordered this encounter  Medications  doxycycline  (VIBRA -TABS) 100 MG tablet    Sig: Take 1 tablet (100 mg total) by mouth 2 (two) times daily for 14 days.    Dispense:  28 tablet    Refill:  0   predniSONE  (STERAPRED UNI-PAK 48 TAB) 10 MG (48) TBPK tablet    Sig: Take by mouth daily. 12-day taper pack, use as directed for taper    Dispense:  48 tablet    Refill:  0   ALPRAZolam  (XANAX ) 0.5 MG tablet    Sig: Take 1 tablet (0.5 mg total) by mouth 2 (two) times daily as needed for anxiety.    Dispense:  45 tablet    Refill:  1    This request is for a new prescription for a controlled substance as required by Federal/State law.    Return in about 2 weeks (around 12/31/2023) for F/u prostate- 40 minute.

## 2023-12-17 NOTE — Assessment & Plan Note (Signed)
 Will treat with course of doxycycline .  Also adding prednisone  as he seems to have some eustachian tube dysfunction this may help with sinus inflammation.  Recommend supportive care at home with good hydration.

## 2024-01-07 ENCOUNTER — Encounter: Payer: Self-pay | Admitting: Family Medicine

## 2024-01-07 ENCOUNTER — Ambulatory Visit: Admitting: Family Medicine

## 2024-01-07 VITALS — BP 113/78 | HR 68 | Ht 71.0 in | Wt 206.1 lb

## 2024-01-07 DIAGNOSIS — F411 Generalized anxiety disorder: Secondary | ICD-10-CM

## 2024-01-07 DIAGNOSIS — N419 Inflammatory disease of prostate, unspecified: Secondary | ICD-10-CM | POA: Diagnosis not present

## 2024-01-07 DIAGNOSIS — Z23 Encounter for immunization: Secondary | ICD-10-CM

## 2024-01-07 DIAGNOSIS — C61 Malignant neoplasm of prostate: Secondary | ICD-10-CM

## 2024-01-07 DIAGNOSIS — I10 Essential (primary) hypertension: Secondary | ICD-10-CM | POA: Diagnosis not present

## 2024-01-07 DIAGNOSIS — R5383 Other fatigue: Secondary | ICD-10-CM

## 2024-01-07 DIAGNOSIS — Z13220 Encounter for screening for lipoid disorders: Secondary | ICD-10-CM

## 2024-01-08 LAB — CMP14+EGFR
ALT: 28 IU/L (ref 0–44)
AST: 18 IU/L (ref 0–40)
Albumin: 4.2 g/dL (ref 3.8–4.9)
Alkaline Phosphatase: 84 IU/L (ref 44–121)
BUN/Creatinine Ratio: 12 (ref 9–20)
BUN: 12 mg/dL (ref 6–24)
Bilirubin Total: 0.7 mg/dL (ref 0.0–1.2)
CO2: 25 mmol/L (ref 20–29)
Calcium: 9.2 mg/dL (ref 8.7–10.2)
Chloride: 106 mmol/L (ref 96–106)
Creatinine, Ser: 1 mg/dL (ref 0.76–1.27)
Globulin, Total: 1.9 g/dL (ref 1.5–4.5)
Glucose: 85 mg/dL (ref 70–99)
Potassium: 5.4 mmol/L — ABNORMAL HIGH (ref 3.5–5.2)
Sodium: 144 mmol/L (ref 134–144)
Total Protein: 6.1 g/dL (ref 6.0–8.5)
eGFR: 89 mL/min/{1.73_m2} (ref >59)

## 2024-01-08 LAB — CBC WITH DIFFERENTIAL/PLATELET
Basophils Absolute: 0.1 10*3/uL (ref 0.0–0.2)
Basos: 1 %
EOS (ABSOLUTE): 0.2 10*3/uL (ref 0.0–0.4)
Eos: 4 %
Hematocrit: 43.5 % (ref 37.5–51.0)
Hemoglobin: 14.5 g/dL (ref 13.0–17.7)
Immature Grans (Abs): 0 10*3/uL (ref 0.0–0.1)
Immature Granulocytes: 0 %
Lymphocytes Absolute: 1 10*3/uL (ref 0.7–3.1)
Lymphs: 23 %
MCH: 30 pg (ref 26.6–33.0)
MCHC: 33.3 g/dL (ref 31.5–35.7)
MCV: 90 fL (ref 79–97)
Monocytes Absolute: 0.3 10*3/uL (ref 0.1–0.9)
Monocytes: 7 %
Neutrophils Absolute: 2.9 10*3/uL (ref 1.4–7.0)
Neutrophils: 65 %
Platelets: 185 10*3/uL (ref 150–450)
RBC: 4.83 x10E6/uL (ref 4.14–5.80)
RDW: 12.9 % (ref 11.6–15.4)
WBC: 4.3 10*3/uL (ref 3.4–10.8)

## 2024-01-08 LAB — LIPID PANEL WITH LDL/HDL RATIO
Cholesterol, Total: 192 mg/dL (ref 100–199)
HDL: 42 mg/dL (ref >39)
LDL Chol Calc (NIH): 136 mg/dL — ABNORMAL HIGH (ref 0–99)
LDL/HDL Ratio: 3.2 ratio (ref 0.0–3.6)
Triglycerides: 74 mg/dL (ref 0–149)
VLDL Cholesterol Cal: 14 mg/dL (ref 5–40)

## 2024-01-08 LAB — PSA: Prostate Specific Ag, Serum: 3.1 ng/mL (ref 0.0–4.0)

## 2024-01-08 LAB — VITAMIN D 25 HYDROXY (VIT D DEFICIENCY, FRACTURES): Vit D, 25-Hydroxy: 29.7 ng/mL — ABNORMAL LOW (ref 30.0–100.0)

## 2024-01-09 NOTE — Assessment & Plan Note (Signed)
 Multiple stressors including worry about cancer as well as conflict with his neighbor.  Occasional use of alprazolam  as needed.

## 2024-01-09 NOTE — Assessment & Plan Note (Signed)
 BP is well controlled with amlodipine  at current strength.  Will plan to continue.

## 2024-01-09 NOTE — Progress Notes (Signed)
 Justin Norris - 54 y.o. male MRN [id number]  Date of birth: Feb 09, 1971  Subjective Chief Complaint  Patient presents with   Medical Management of Chronic Issues    HPI Justin Norris is a 53 y.o. male here today for follow up.   He reports that he is doing pretty well.  Would like to have full set of annual labs completed.  Continues to have some anxiety related to dealing with his neighbor.  Rare use of alprazolam .  Blood pressure remains well-controlled with amlodipine .  Denies side effects at this time, shortness of breath, palpitations, headaches or vision changes.  ROS:  A comprehensive ROS was completed and negative except as noted per HPI  Allergies  Allergen Reactions   Coconut Fatty Acid Anaphylaxis    Past Medical History:  Diagnosis Date   ANXIETY 07/15/2007   BACK PAIN, RIGHT 07/10/2010   Cancer (HCC)    Headache(784.0) 07/15/2007   History of kidney stones    NEPHROLITHIASIS, HX OF 07/15/2007    Past Surgical History:  Procedure Laterality Date   CYSTOSCOPY N/A 06/01/2023   Procedure: CYSTOSCOPY;  Surgeon: Shona Layman BROCKS, MD;  Location: [facility];  Service: Urology;  Laterality: N/A;   HERNIA REPAIR     LIH   PROSTATE BIOPSY     RADIOACTIVE SEED IMPLANT Bilateral 06/01/2023   Procedure: RADIOACTIVE SEED IMPLANT/BRACHYTHERAPY IMPLANT;  Surgeon: Shona Layman BROCKS, MD;  Location: [facility];  Service: Urology;  Laterality: Bilateral;   SPACE OAR INSTILLATION Bilateral 06/01/2023   Procedure: SPACE OAR INSTILLATION;  Surgeon: Shona Layman BROCKS, MD;  Location: [facility];  Service: Urology;  Laterality: Bilateral;    Social History   Socioeconomic History   Marital status: Married    Spouse name: Not on file   Number of children: 9   Years of education: Not on file   Highest education level: Bachelor's degree (e.g., BA, AB, BS)  Occupational History   Occupation: Environmental health practitioner: [employer]  Tobacco Use   Smoking status: Former    Current packs/day: 0.00    Types: Cigarettes    Start date: 07/03/1994    Quit date: 07/03/2014    Years since quitting: 9.5    Passive exposure: Never   Smokeless tobacco: Never  Vaping Use   Vaping status: Never Used  Substance and Sexual Activity   Alcohol use: No    Alcohol/week: 0.0 standard drinks of alcohol    Comment: rare   Drug use: No   Sexual activity: Yes    Partners: Female  Other Topics Concern   Not on file  Social History Narrative   Not on file   Social Drivers of Health   Financial Resource Strain: Low Risk  (01/07/2024)   Overall Financial Resource Strain (CARDIA)    Difficulty of Paying Living Expenses: Not hard at all  Food Insecurity: No Food Insecurity (01/07/2024)   Hunger Vital Sign    Worried About Running Out of Food in the Last Year: Never true    Ran Out of Food in the Last Year: Never true  Transportation Needs: No Transportation Needs (01/07/2024)   PRAPARE - Administrator, Civil Service (Medical): No    Lack of Transportation (Non-Medical): No  Physical Activity: Sufficiently Active (07/06/2023)   Exercise Vital Sign    Days of Exercise per Week: 5 days    Minutes of Exercise per Session: 30 min  Stress:  No Stress Concern Present (01/07/2024)   [employer] of Occupational Health - Occupational Stress Questionnaire    Feeling of Stress: Only a little  Social Connections: Socially Integrated (01/07/2024)   Social Connection and Isolation Panel    Frequency of Communication with Friends and Family: More than three times a week    Frequency of Social Gatherings with Friends and Family: Three times a week    Attends Religious Services: 1 to 4 times per year    Active Member of Clubs or Organizations: Yes    Attends Engineer, structural: More than 4 times per year    Marital Status: Married    Family History  Problem Relation Age of Onset   Heart disease  Father    Skin cancer Father     Health Maintenance  Topic Date Due   HIV Screening  Never done   Hepatitis C Screening  Never done   Pneumococcal Vaccine 56-43 Years old (1 of 2 - PCV) Never done   Colonoscopy  Never done   INFLUENZA VACCINE  02/18/2024   COVID-19 Vaccine (7 - Pfizer risk 2024-25 season) 07/08/2024   DTaP/Tdap/Td (2 - Td or Tdap) 12/06/2032   Zoster Vaccines- Shingrix   Completed   HPV VACCINES  Aged Out   Meningococcal B Vaccine  Aged Out     ----------------------------------------------------------------------------------------------------------------------------------------------------------------------------------------------------------------- Physical Exam BP 113/78   Pulse 68   Ht 5' 11 (1.803 m)   Wt 206 lb 1.3 oz (93.5 kg)   SpO2 96%   BMI 28.74 kg/m   Physical Exam Constitutional:      Appearance: Normal appearance.   Eyes:     General: No scleral icterus.   Cardiovascular:     Rate and Rhythm: Normal rate and regular rhythm.  Pulmonary:     Effort: Pulmonary effort is normal.     Breath sounds: Normal breath sounds.   Neurological:     General: No focal deficit present.     Mental Status: He is alert.   Psychiatric:        Mood and Affect: Mood normal.        Behavior: Behavior normal.     ------------------------------------------------------------------------------------------------------------------------------------------------------------------------------------------------------------------- Assessment and Plan  Essential hypertension BP is well controlled with amlodipine  at current strength.  Will plan to continue.   Anxiety state Multiple stressors including worry about cancer as well as conflict with his neighbor.  Occasional use of alprazolam  as needed.   No orders of the defined types were placed in this encounter.   Return in about 3 months (around 04/08/2024) for f/u prostate cancer.

## 2024-01-10 ENCOUNTER — Ambulatory Visit: Payer: Self-pay | Admitting: Family Medicine

## 2024-01-24 ENCOUNTER — Ambulatory Visit: Admitting: Urology

## 2024-01-24 NOTE — Progress Notes (Deleted)
 Assessment: 1. Prostate cancer (HCC); PSA 7.48; GG 1,2; unfavorable intermediate risk (>50% cores +)     Plan: Continue tamsulosin  0.4 mg BID Return to office in 3 months with PSA  I personally spent 30 minutes involved in face to face and non-face-to-face activities for this patient on the day of the visit.  Professional time spent included the following activities, in addition to those noted in the documentation: Answering a number of questions regarding his treatment, current symptoms in relation to his prostate cancer diagnosis, and expectations moving forward.   Chief Complaint:  No chief complaint on file.   History of Present Illness:  Justin Norris is a 53 y.o. male who is seen for follow-up of unfavorable intermediate risk prostate cancer status post brachytherapy in November 2024.  He was found to have an elevated PSA. PSA results: 2/24 6.76 5/24 7.48  No history of UTIs or prostatitis.  His father was diagnosed with prostate cancer in his 50s and was treated with brachytherapy.  He was diagnosed with low testosterone  and has been treated with testosterone  replacement since February 2024.  He has been using testosterone  injections every 2 weeks.  He is also using tadalafil  20 mg as needed for erectile dysfunction.  He reports improvement in his libido and in his erectile function with testosterone  replacement.  He has a history of a left undescended testicle undergoing left orchiopexy in 1977.  He underwent transrectal ultrasound and biopsy of the prostate on 01/13/23. TRUS volume:  27 ml  PSA density:  0.27 Biopsy results:             Gleason score: 3+ 4 = 7, 3 + 3 = 6            # positive cores: 3/6 on right    5/6 on left            Location of cancer: apex, mid gland, and base  Complications after biopsy: none Decipher: Low risk PSMA PET scan negative for metastatic disease.  He underwent low-dose rate brachytherapy in November 2024.  At his visit in April  2024, he was having lower urinary tract symptoms including frequency, urgency, nocturia x 2, decreased stream, and straining.  No dysuria or gross hematuria. He continued on tamsulosin  0.8 mg daily. IPSS = 18/3. He was treated with antibiotics for possible prostatitis.  Post treatment PSA: 4/25 5.1 5/25 3.0 6/25 3.1  Portions of the above documentation were copied from a prior visit for review purposes only.   Past Medical History:  Past Medical History:  Diagnosis Date   ANXIETY 07/15/2007   BACK PAIN, RIGHT 07/10/2010   Cancer (HCC)    Headache(784.0) 07/15/2007   History of kidney stones    NEPHROLITHIASIS, HX OF 07/15/2007    Past Surgical History:  Past Surgical History:  Procedure Laterality Date   CYSTOSCOPY N/A 06/01/2023   Procedure: CYSTOSCOPY;  Surgeon: Shona Layman BROCKS, MD;  Location: [facility];  Service: Urology;  Laterality: N/A;   HERNIA REPAIR     LIH   PROSTATE BIOPSY     RADIOACTIVE SEED IMPLANT Bilateral 06/01/2023   Procedure: RADIOACTIVE SEED IMPLANT/BRACHYTHERAPY IMPLANT;  Surgeon: Shona Layman BROCKS, MD;  Location: [facility];  Service: Urology;  Laterality: Bilateral;   SPACE OAR INSTILLATION Bilateral 06/01/2023   Procedure: SPACE OAR INSTILLATION;  Surgeon: Shona Layman BROCKS, MD;  Location: [facility];  Service: Urology;  Laterality: Bilateral;    Allergies:  Allergies  Allergen Reactions   Coconut Fatty Acid Anaphylaxis    Family History:  Family History  Problem Relation Age of Onset   Heart disease Father    Skin cancer Father     Social History:  Social History   Tobacco Use   Smoking status: Former    Current packs/day: 0.00    Types: Cigarettes    Start date: 07/03/1994    Quit date: 07/03/2014    Years since quitting: 9.5    Passive exposure: Never   Smokeless tobacco: Never  Vaping Use   Vaping status: Never Used  Substance Use Topics   Alcohol use: No    Alcohol/week:  0.0 standard drinks of alcohol    Comment: rare   Drug use: No    ROS: Constitutional:  Negative for fever, chills, weight loss CV: Negative for chest pain, previous MI, hypertension Respiratory:  Negative for shortness of breath, wheezing, sleep apnea, frequent cough GI:  Negative for nausea, vomiting, bloody stool, GERD  Physical exam: There were no vitals taken for this visit. GENERAL APPEARANCE:  Well appearing, well developed, well nourished, NAD HEENT:  Atraumatic, normocephalic, oropharynx clear NECK:  Supple without lymphadenopathy or thyromegaly ABDOMEN:  Soft, non-tender, no masses EXTREMITIES:  Moves all extremities well, without clubbing, cyanosis, or edema NEUROLOGIC:  Alert and oriented x 3, normal gait, CN II-XII grossly intact MENTAL STATUS:  appropriate BACK:  Non-tender to palpation, No CVAT SKIN:  Warm, dry, and intact  Results: U/A:

## 2024-02-04 ENCOUNTER — Ambulatory Visit: Payer: Self-pay

## 2024-02-04 NOTE — Telephone Encounter (Signed)
 FYI Only or Action Required?: Action required by provider: medication refill request.  Patient was last seen in primary care on 01/07/2024 by Alvia Bring, DO.  Called Nurse Triage reporting Prostate pain.  Symptoms began yesterday.  Interventions attempted: Nothing.  Symptoms are: unchanged. Prostate pain since last night. I just need Levaquin  called in. Declines OV.  Triage Disposition: See Physician Within 24 Hours  Patient/caregiver understands and will follow disposition?:     Copied from CRM [id number]. Topic: Clinical - Red Word Triage >> Feb 04, 2024 12:32 PM Suzette B wrote: [facility] that prompted transfer to Nurse Triage: patient is calling due to severe prostate pain and is unable to sit comfortably, he is wanting to come in or have a virtual appt or get medication that was prescribed to him previously. I advised the patient that I would like to get him over to triage nurse for further assistance. He volunteered to inform me that on a scale from 1-10 he is currently at a 5 Reason for Disposition  [1] Pain comes and goes (intermittent) AND [2] present > 24 hours  Answer Assessment - Initial Assessment Questions 1. LOCATION and RADIATION: Where is the pain located?      Prostate 2. QUALITY: What does the pain feel like?  (e.g., sharp, dull, aching, burning)     Pressure 3. SEVERITY: How bad is the pain?  (Scale 1-10; or mild, moderate, severe)   - MILD (1-3): doesn't interfere with normal activities    - MODERATE (4-7): interferes with normal activities (e.g., work or school) or awakens from sleep   - SEVERE (8-10): excruciating pain, unable to do any normal activities, difficulty walking     severe 4. ONSET: When did the pain start?     Last night 5. PATTERN: Does it come and go, or has it been constant since it started?     Constant 6. SCROTAL APPEARANCE: What does the scrotum look like? Is there any swelling or redness?      no 7. HERNIA: Has a  doctor ever told you that you have a hernia?     no 8. OTHER SYMPTOMS: Do you have any other symptoms? (e.g., fever, abdominal pain, vomiting, difficulty passing urine)     Difficulty passing urine  Protocols used: Scrotal Pain-A-AH

## 2024-02-04 NOTE — Telephone Encounter (Signed)
 Spoke with patient.  States that  he has history of prostate cancer. And noticed x last night -  scrotal pain and swelling and some difficulty in urination.  Dr. Alvan reviewed the patient chart and recommended that he be seen at urgent care as  with the difficulty with passing urine could develop into an obstruction and will need to be evaluated for this.  Patient informed of recommendation to go to urgent care and reasons why this was recommended as above. Patient did not verbalize if he would go to urgent care or not and hung up without  ending conversation.

## 2024-03-03 ENCOUNTER — Ambulatory Visit: Admitting: Urology

## 2024-03-21 ENCOUNTER — Ambulatory Visit: Admitting: Family Medicine

## 2024-03-21 ENCOUNTER — Encounter: Payer: Self-pay | Admitting: Sports Medicine

## 2024-03-21 ENCOUNTER — Encounter: Payer: Self-pay | Admitting: Family Medicine

## 2024-03-21 VITALS — BP 101/67 | HR 63 | Ht 71.0 in | Wt 208.0 lb

## 2024-03-21 DIAGNOSIS — R39198 Other difficulties with micturition: Secondary | ICD-10-CM | POA: Diagnosis not present

## 2024-03-21 DIAGNOSIS — M5441 Lumbago with sciatica, right side: Secondary | ICD-10-CM

## 2024-03-21 DIAGNOSIS — I10 Essential (primary) hypertension: Secondary | ICD-10-CM | POA: Diagnosis not present

## 2024-03-21 DIAGNOSIS — M545 Low back pain, unspecified: Secondary | ICD-10-CM | POA: Insufficient documentation

## 2024-03-21 DIAGNOSIS — C61 Malignant neoplasm of prostate: Secondary | ICD-10-CM

## 2024-03-21 DIAGNOSIS — I872 Venous insufficiency (chronic) (peripheral): Secondary | ICD-10-CM | POA: Diagnosis not present

## 2024-03-21 DIAGNOSIS — Z23 Encounter for immunization: Secondary | ICD-10-CM | POA: Diagnosis not present

## 2024-03-21 MED ORDER — METHYLPREDNISOLONE ACETATE 80 MG/ML IJ SUSP
80.0000 mg | Freq: Once | INTRAMUSCULAR | Status: AC
  Administered 2024-03-21: 80 mg via INTRAMUSCULAR

## 2024-03-21 MED ORDER — AMLODIPINE BESYLATE 2.5 MG PO TABS: 2.5000 mg | ORAL_TABLET | Freq: Every day | ORAL | 1 refills | Status: AC

## 2024-03-21 MED ORDER — FUROSEMIDE 20 MG PO TABS: 20.0000 mg | ORAL_TABLET | Freq: Every day | ORAL | 3 refills | Status: DC | PRN

## 2024-03-21 NOTE — Assessment & Plan Note (Signed)
 He finds compression socks uncomfortable.  Adding furosemide  as needed.  Encouraged to keep legs elevated when at rest.  Low-sodium diet recommended.

## 2024-03-21 NOTE — Assessment & Plan Note (Signed)
 Status post brachytherapy.  He does note some increased perineal pressure as well as slowed emptying.  Checking urinalysis and PSA to be sure he does not have prostatitis.  He does have upcoming visit with new urologist as well.

## 2024-03-21 NOTE — Assessment & Plan Note (Signed)
 BP is well controlled with amlodipine  at current strength.  Will plan to continue.

## 2024-03-21 NOTE — Progress Notes (Signed)
 Justin Norris - 53 y.o. male MRN [id number]  Date of birth: 02-12-1971  Subjective Chief Complaint  Patient presents with   Back Pain    HPI Justin Norris is a 53 y.o. male here today for follow up visit.  He reports overall he is doing okay.  Remains concerned about his PSA.  He also feels like he is having more pressure in the perineum area as well as slow urinary flow.  He does continue on Flomax .  He has upcoming visit with urology at [facility].  He has not noted any blood in his urine.  Blood pressure is well-controlled with amlodipine  at current strength.  He does note a little bit of puffiness around the ankles but this started prior to addition of amlodipine .  Denies chest pain, shortness of breath, palpitations, headaches or vision changes.  He has had some low back pain with radiation into the buttock area.  Denies weakness, numbness or tingling.  ROS:  A comprehensive ROS was completed and negative except as noted per HPI  Allergies  Allergen Reactions   Coconut Fatty Acid Anaphylaxis  Next month.  Past Medical History:  Diagnosis Date   ANXIETY 07/15/2007   BACK PAIN, RIGHT 07/10/2010   Cancer (HCC)    Headache(784.0) 07/15/2007   History of kidney stones    NEPHROLITHIASIS, HX OF 07/15/2007    Past Surgical History:  Procedure Laterality Date   CYSTOSCOPY N/A 06/01/2023   Procedure: CYSTOSCOPY;  Surgeon: Shona Layman BROCKS, MD;  Location: [facility];  Service: Urology;  Laterality: N/A;   HERNIA REPAIR     LIH   PROSTATE BIOPSY     RADIOACTIVE SEED IMPLANT Bilateral 06/01/2023   Procedure: RADIOACTIVE SEED IMPLANT/BRACHYTHERAPY IMPLANT;  Surgeon: Shona Layman BROCKS, MD;  Location: [facility];  Service: Urology;  Laterality: Bilateral;   SPACE OAR INSTILLATION Bilateral 06/01/2023   Procedure: SPACE OAR INSTILLATION;  Surgeon: Shona Layman BROCKS, MD;  Location: [facility];  Service: Urology;  Laterality: Bilateral;     Social History   Socioeconomic History   Marital status: Married    Spouse name: Not on file   Number of children: 9   Years of education: Not on file   Highest education level: Bachelor's degree (e.g., BA, AB, BS)  Occupational History   Occupation: Environmental health practitioner: [employer]  Tobacco Use   Smoking status: Former    Current packs/day: 0.00    Types: Cigarettes    Start date: 07/03/1994    Quit date: 07/03/2014    Years since quitting: 9.7    Passive exposure: Never   Smokeless tobacco: Never  Vaping Use   Vaping status: Never Used  Substance and Sexual Activity   Alcohol use: No    Alcohol/week: 0.0 standard drinks of alcohol    Comment: rare   Drug use: No   Sexual activity: Yes    Partners: Female  Other Topics Concern   Not on file  Social History Narrative   Not on file   Social Drivers of Health   Financial Resource Strain: Low Risk  (01/07/2024)   Overall Financial Resource Strain (CARDIA)    Difficulty of Paying Living Expenses: Not hard at all  Food Insecurity: No Food Insecurity (01/07/2024)   Hunger Vital Sign    Worried About Running Out of Food in the Last Year: Never true    Ran Out of Food in the Last Year: Never true  Transportation Needs: No Transportation Needs (01/07/2024)   PRAPARE - Administrator, Civil Service (Medical): No    Lack of Transportation (Non-Medical): No  Physical Activity: Sufficiently Active (07/06/2023)   Exercise Vital Sign    Days of Exercise per Week: 5 days    Minutes of Exercise per Session: 30 min  Stress: No Stress Concern Present (01/07/2024)   [employer] of Occupational Health - Occupational Stress Questionnaire    Feeling of Stress: Only a little  Social Connections: Socially Integrated (01/07/2024)   Social Connection and Isolation Panel    Frequency of Communication with Friends and Family: More than three times a week    Frequency of Social Gatherings with Friends and  Family: Three times a week    Attends Religious Services: 1 to 4 times per year    Active Member of Clubs or Organizations: Yes    Attends Engineer, structural: More than 4 times per year    Marital Status: Married    Family History  Problem Relation Age of Onset   Heart disease Father    Skin cancer Father     Health Maintenance  Topic Date Due   HIV Screening  Never done   Hepatitis C Screening  Never done   Pneumococcal Vaccine: 50+ Years (1 of 2 - PCV) Never done   Hepatitis B Vaccines 19-59 Average Risk (1 of 3 - 19+ 3-dose series) Never done   Colonoscopy  Never done   COVID-19 Vaccine (7 - Pfizer risk 2024-25 season) 07/08/2024   DTaP/Tdap/Td (2 - Td or Tdap) 12/06/2032   INFLUENZA VACCINE  Completed   Zoster Vaccines- Shingrix   Completed   HPV VACCINES  Aged Out   Meningococcal B Vaccine  Aged Out     ----------------------------------------------------------------------------------------------------------------------------------------------------------------------------------------------------------------- Physical Exam BP 101/67 (BP Location: Left Arm, Patient Position: Sitting, Cuff Size: Normal)   Pulse 63   Ht 5' 11 (1.803 m)   Wt 208 lb (94.3 kg)   SpO2 97%   BMI 29.01 kg/m   Physical Exam Constitutional:      Appearance: Normal appearance.  Cardiovascular:     Rate and Rhythm: Normal rate and regular rhythm.  Pulmonary:     Effort: Pulmonary effort is normal.     Breath sounds: Normal breath sounds.  Neurological:     General: No focal deficit present.     Mental Status: He is alert.  Psychiatric:        Mood and Affect: Mood normal.        Behavior: Behavior normal.     ------------------------------------------------------------------------------------------------------------------------------------------------------------------------------------------------------------------- Assessment and Plan  Prostate cancer (HCC); PSA 7.48;  GG 1,2; unfavorable intermediate risk (>50% cores +) Status post brachytherapy.  He does note some increased perineal pressure as well as slowed emptying.  Checking urinalysis and PSA to be sure he does not have prostatitis.  He does have upcoming visit with new urologist as well.  Essential hypertension BP is well controlled with amlodipine  at current strength.  Will plan to continue.   Low back pain Given home exercise plan.  Injection of Depo-Medrol  today.  If not improving we will plan to complete imaging.  Venous insufficiency He finds compression socks uncomfortable.  Adding furosemide  as needed.  Encouraged to keep legs elevated when at rest.  Low-sodium diet recommended.   Meds ordered this encounter  Medications   amLODipine  (NORVASC ) 2.5 MG tablet    Sig: Take 1 tablet (2.5 mg total) by mouth daily.  Dispense:  90 tablet    Refill:  1   furosemide  (LASIX ) 20 MG tablet    Sig: Take 1 tablet (20 mg total) by mouth daily as needed for edema or fluid.    Dispense:  30 tablet    Refill:  3   methylPREDNISolone  acetate (DEPO-MEDROL ) injection 80 mg    No follow-ups on file.

## 2024-03-21 NOTE — Assessment & Plan Note (Signed)
 Given home exercise plan.  Injection of Depo-Medrol  today.  If not improving we will plan to complete imaging.

## 2024-03-22 LAB — URINALYSIS, ROUTINE W REFLEX MICROSCOPIC
Bilirubin, UA: NEGATIVE
Glucose, UA: NEGATIVE
Ketones, UA: NEGATIVE
Leukocytes,UA: NEGATIVE
Nitrite, UA: NEGATIVE
Protein,UA: NEGATIVE
RBC, UA: NEGATIVE
Specific Gravity, UA: 1.015 (ref 1.005–1.030)
Urobilinogen, Ur: 0.2 mg/dL (ref 0.2–1.0)
pH, UA: 7 (ref 5.0–7.5)

## 2024-03-22 LAB — PSA: Prostate Specific Ag, Serum: 2 ng/mL (ref 0.0–4.0)

## 2024-03-24 ENCOUNTER — Ambulatory Visit: Payer: Self-pay | Admitting: Family Medicine

## 2024-04-04 ENCOUNTER — Ambulatory Visit: Admitting: Urology

## 2024-04-04 ENCOUNTER — Encounter: Payer: Self-pay | Admitting: Urology

## 2024-04-04 VITALS — BP 102/63 | HR 60 | Ht 71.5 in | Wt 201.0 lb

## 2024-04-04 DIAGNOSIS — R399 Unspecified symptoms and signs involving the genitourinary system: Secondary | ICD-10-CM

## 2024-04-04 DIAGNOSIS — C61 Malignant neoplasm of prostate: Secondary | ICD-10-CM | POA: Diagnosis not present

## 2024-04-04 DIAGNOSIS — R972 Elevated prostate specific antigen [PSA]: Secondary | ICD-10-CM

## 2024-04-04 LAB — BLADDER SCAN AMB NON-IMAGING

## 2024-04-04 NOTE — Progress Notes (Signed)
 04/04/2024 5:20 PM   Wadie LELON Moats 09-Dec-1970 [id number]  Referring provider: Alvia Bring, DO [address] [facility] [address] [place],  [place] [address]  Chief Complaint  Patient presents with   Other    Urologic history:  1.  Prostate cancer PSA 08/2022-6.76; repeat 11/2022-7.48 PBx 01/13/2023: Volume 27 cc Path: 5/12 Gleason 3+3 (15%, 7%, 50%, 20%, 80%); 3/12 Gleason 3+4 (10%, 10%, 1%); 4/12 benign.  Decipher Genomic Risk: Low NCCN restratification: Unfavorable intermediate (>50% cores +) PSMA/PET negative Treatment: After discussing management options he elected LDR brachytherapy performed 06/01/2023 Dr. Shona  HPI: Justin Norris is a 53 y.o. male presents to establish local urologic care  Previously followed [facility] with urologic history as above Post procedure has had urinary frequency, urgency, hesitancy and weak urinary stream.  On tamsulosin  0.8 mg daily Last urology visit Dr. Roseann 10/21/23 and at that visit IPSS 18/3 Notes perineal discomfort at times.  No gross hematuria  PSA trend    Prostate Specific Ag, Serum  Latest Ref Rng 0.0 - 4.0 ng/mL  09/07/2022 6.76  12/07/2022 7.48  03/10/2023 6.5  10/21/2023 5.1  11/19/2023 3.0  01/07/2024 3.1  03/21/2024 2.0    PMH: Past Medical History:  Diagnosis Date   ANXIETY 07/15/2007   BACK PAIN, RIGHT 07/10/2010   Cancer (HCC)    Headache(784.0) 07/15/2007   History of kidney stones    NEPHROLITHIASIS, HX OF 07/15/2007    Surgical History: Past Surgical History:  Procedure Laterality Date   CYSTOSCOPY N/A 06/01/2023   Procedure: CYSTOSCOPY;  Surgeon: Shona Layman BROCKS, MD;  Location: [facility];  Service: Urology;  Laterality: N/A;   HERNIA REPAIR     LIH   PROSTATE BIOPSY     RADIOACTIVE SEED IMPLANT Bilateral 06/01/2023   Procedure: RADIOACTIVE SEED IMPLANT/BRACHYTHERAPY IMPLANT;  Surgeon: Shona Layman BROCKS, MD;  Location: [facility];  Service:  Urology;  Laterality: Bilateral;   SPACE OAR INSTILLATION Bilateral 06/01/2023   Procedure: SPACE OAR INSTILLATION;  Surgeon: Shona Layman BROCKS, MD;  Location: [facility];  Service: Urology;  Laterality: Bilateral;    Home Medications:  Allergies as of 04/04/2024       Reactions   Coconut Fatty Acid Anaphylaxis        Medication List        Accurate as of April 04, 2024  5:20 PM. If you have any questions, ask your nurse or doctor.          ALPRAZolam  0.5 MG tablet Commonly known as: XANAX  Take 1 tablet (0.5 mg total) by mouth 2 (two) times daily as needed for anxiety.   amLODipine  2.5 MG tablet Commonly known as: NORVASC  Take 1 tablet (2.5 mg total) by mouth daily.   furosemide  20 MG tablet Commonly known as: LASIX  Take 1 tablet (20 mg total) by mouth daily as needed for edema or fluid.   tadalafil  20 MG tablet Commonly known as: CIALIS  TAKE 1 TABLET BY MOUTH EVERY DAY AS NEEDED FOR ERECTILE DYSFUNCTION   tamsulosin  0.4 MG Caps capsule Commonly known as: FLOMAX  Take 2 capsules (0.8 mg total) by mouth daily.        Allergies:  Allergies  Allergen Reactions   Coconut Fatty Acid Anaphylaxis    Family History: Family History  Problem Relation Age of Onset   Heart disease Father    Skin cancer Father     Social History:  reports that he quit  smoking about 9 years ago. His smoking use included cigarettes. He started smoking about 29 years ago. He has never been exposed to tobacco smoke. He has never used smokeless tobacco. He reports that he does not drink alcohol and does not use drugs.   Physical Exam: BP 102/63   Pulse 60   Ht 5' 11.5 (1.816 m)   Wt 201 lb (91.2 kg)   BMI 27.64 kg/m   Constitutional:  Alert, No acute distress. HEENT: [place] AT Respiratory: Normal respiratory effort, no increased work of breathing. Psychiatric: Normal mood and affect.   Assessment & Plan:    1.  Prostate cancer T1c unfavorable intermediate  risk prostate cancer s/p LDR brachytherapy 05/2023 PSA trending downward 41-month follow-up with PSA  2.  Lower urinary tract symptoms Post implant LUTS Symptoms improved on tamsulosin  PVR today 46 mL We discussed potential etiologies including postradiation inflammation and urethral stricture.  Discussed cystoscopy for worsening symptoms or incomplete bladder emptying   Glendia JAYSON Barba, MD  [facility] [address] [place], [place] [address] [phone]

## 2024-04-05 ENCOUNTER — Encounter: Payer: Self-pay | Admitting: Family Medicine

## 2024-04-05 ENCOUNTER — Ambulatory Visit: Admitting: Family Medicine

## 2024-04-05 VITALS — BP 99/64 | HR 56 | Ht 70.0 in | Wt 206.0 lb

## 2024-04-05 DIAGNOSIS — Z23 Encounter for immunization: Secondary | ICD-10-CM

## 2024-04-05 DIAGNOSIS — I10 Essential (primary) hypertension: Secondary | ICD-10-CM | POA: Diagnosis not present

## 2024-04-05 DIAGNOSIS — C61 Malignant neoplasm of prostate: Secondary | ICD-10-CM

## 2024-04-05 NOTE — Assessment & Plan Note (Signed)
 BP is well controlled with amlodipine  at current strength.  Will plan to continue.

## 2024-04-05 NOTE — Assessment & Plan Note (Signed)
 He is seeing a new urologist at [facility]  Stable at this time.

## 2024-04-05 NOTE — Progress Notes (Signed)
 Justin Norris - 53 y.o. male MRN [id number]  Date of birth: [birth year]  Subjective Chief Complaint  Patient presents with   Follow-up    HPI Justin Norris is a 53 y.o. male here today for follow-up visit.  He is established with new urologist.  His last PSA levels were trending down.  He denies new urinary symptoms including frequency, urgency.  He does have occasional perineal pain but denies any at this time.  Blood pressure remains well-controlled with amlodipine  at current strength.  Denies side effects  ROS:  A comprehensive ROS was completed and negative except as noted per HPI  Allergies  Allergen Reactions   Coconut Fatty Acid Anaphylaxis    Past Medical History:  Diagnosis Date   ANXIETY 07/15/2007   BACK PAIN, RIGHT 07/10/2010   Cancer (HCC)    Headache(784.0) 07/15/2007   History of kidney stones    NEPHROLITHIASIS, HX OF 07/15/2007    Past Surgical History:  Procedure Laterality Date   CYSTOSCOPY N/A 06/01/2023   Procedure: CYSTOSCOPY;  Surgeon: Shona Layman BROCKS, MD;  Location[facility];  Service: Urology;  Laterality: N/A;   HERNIA REPAIR     LIH   PROSTATE BIOPSY     RADIOACTIVE SEED IMPLANT Bilateral 06/01/2023   Procedure: RADIOACTIVE SEED IMPLANT/BRACHYTHERAPY IMPLANT;  Surgeon: Shona Layman BROCKS, MD;  Location: [facility];  Service: Urology;  Laterality: Bilateral;   SPACE OAR INSTILLATION Bilateral 06/01/2023   Procedure: SPACE OAR INSTILLATION;  Surgeon: Shona Layman BROCKS, MD;  Location: [facility];  Service: Urology;  Laterality: Bilateral;    Social History   Socioeconomic History   Marital status: Married    Spouse name: Not on file   Number of children: 9   Years of education: Not on file   Highest education level: Bachelor's degree (e.g., BA, AB, BS)  Occupational History   Occupation: Environmental health practitioner: [employer]  Tobacco Use   Smoking status: Former    Current  packs/day: 0.00    Types: Cigarettes    Start date: 07/03/1994    Quit date: 07/03/2014    Years since quitting: 9.7    Passive exposure: Never   Smokeless tobacco: Never  Vaping Use   Vaping status: Never Used  Substance and Sexual Activity   Alcohol use: No    Alcohol/week: 0.0 standard drinks of alcohol    Comment: rare   Drug use: No   Sexual activity: Yes    Partners: Female  Other Topics Concern   Not on file  Social History Narrative   Not on file   Social Drivers of Health   Financial Resource Strain: Low Risk  (01/07/2024)   Overall Financial Resource Strain (CARDIA)    Difficulty of Paying Living Expenses: Not hard at all  Food Insecurity: No Food Insecurity (01/07/2024)   Hunger Vital Sign    Worried About Running Out of Food in the Last Year: Never true    Ran Out of Food in the Last Year: Never true  Transportation Needs: No Transportation Needs (01/07/2024)   PRAPARE - Administrator, Civil Service (Medical): No    Lack of Transportation (Non-Medical): No  Physical Activity: Sufficiently Active (07/06/2023)   Exercise Vital Sign    Days of Exercise per Week: 5 days    Minutes of Exercise per Session: 30 min  Stress: No Stress Concern Present (01/07/2024)   [employer] of Occupational Health -  Occupational Stress Questionnaire    Feeling of Stress: Only a little  Social Connections: Socially Integrated (01/07/2024)   Social Connection and Isolation Panel    Frequency of Communication with Friends and Family: More than three times a week    Frequency of Social Gatherings with Friends and Family: Three times a week    Attends Religious Services: 1 to 4 times per year    Active Member of Clubs or Organizations: Yes    Attends Engineer, structural: More than 4 times per year    Marital Status: Married    Family History  Problem Relation Age of Onset   Heart disease Father    Skin cancer Father     Health Maintenance  Topic Date  Due   HIV Screening  Never done   Hepatitis C Screening  Never done   Hepatitis B Vaccines 19-59 Average Risk (1 of 3 - 19+ 3-dose series) Never done   Colonoscopy  Never done   COVID-19 Vaccine (8 - 2025-26 season) 05/31/2024   DTaP/Tdap/Td (2 - Td or Tdap) 12/06/2032   Pneumococcal Vaccine: 50+ Years  Completed   Influenza Vaccine  Completed   Zoster Vaccines- Shingrix   Completed   HPV VACCINES  Aged Out   Meningococcal B Vaccine  Aged Out     ----------------------------------------------------------------------------------------------------------------------------------------------------------------------------------------------------------------- Physical Exam BP 99/64 (BP Location: Left Arm, Patient Position: Sitting, Cuff Size: Normal)   Pulse (!) 56   Ht 5' 10 (1.778 m)   Wt 206 lb (93.4 kg)   SpO2 98%   BMI 29.56 kg/m   Physical Exam Constitutional:      Appearance: Normal appearance.  HENT:     Head: Normocephalic and atraumatic.  Eyes:     General: No scleral icterus. Cardiovascular:     Rate and Rhythm: Normal rate and regular rhythm.  Pulmonary:     Effort: Pulmonary effort is normal.     Breath sounds: Normal breath sounds.  Neurological:     Mental Status: He is alert.  Psychiatric:        Mood and Affect: Mood normal.        Behavior: Behavior normal.     ------------------------------------------------------------------------------------------------------------------------------------------------------------------------------------------------------------------- Assessment and Plan  Prostate cancer (HCC); PSA 7.48; GG 1,2; unfavorable intermediate risk (>50% cores +) He is seeing a new urologist at [facility]  Stable at this time.  Essential hypertension BP is well controlled with amlodipine  at current strength.  Will plan to continue.    No orders of the defined types were placed in this encounter.   Return in about 4 months (around  08/05/2024) for f/u prostate.

## 2024-05-21 ENCOUNTER — Other Ambulatory Visit: Payer: Self-pay | Admitting: Urology

## 2024-05-21 ENCOUNTER — Other Ambulatory Visit: Payer: Self-pay | Admitting: Family Medicine

## 2024-05-26 ENCOUNTER — Other Ambulatory Visit: Payer: Self-pay | Admitting: Family Medicine

## 2024-05-26 NOTE — Telephone Encounter (Signed)
Prescription already at pharmacy

## 2024-05-26 NOTE — Telephone Encounter (Signed)
 Copied from CRM #[id number]. Topic: Clinical - Medication Refill >> May 26, 2024  9:13 AM Charlet HERO wrote: Medication: amLODipine  (NORVASC ) 2.5 MG tablet  Has the patient contacted their pharmacy? Yes Patient is calling to state that the med is denied  This is the patient's preferred pharmacy:  [id number] [place], [place] - [address] Justin Norris Justin Norris [place] [address] Phone: [phone] Fax: [phone]  Is this the correct pharmacy for this prescription? Yes If no, delete pharmacy and type the correct one.   Has the prescription been filled recently? Yes  Is the patient out of the medication? Yes  Has the patient been seen for an appointment in the last year OR does the patient have an upcoming appointment? Yes  Can we respond through MyChart? No  Agent: Please be advised that Rx refills may take up to 3 business days. We ask that you follow-up with your pharmacy.

## 2024-05-30 ENCOUNTER — Ambulatory Visit: Admitting: Family Medicine

## 2024-05-31 ENCOUNTER — Encounter: Payer: Self-pay | Admitting: Family Medicine

## 2024-05-31 ENCOUNTER — Ambulatory Visit: Admitting: Family Medicine

## 2024-05-31 VITALS — BP 131/87 | HR 77 | Ht 72.0 in | Wt 197.8 lb

## 2024-05-31 DIAGNOSIS — C61 Malignant neoplasm of prostate: Secondary | ICD-10-CM

## 2024-05-31 NOTE — Assessment & Plan Note (Signed)
 Status post brachytherapy.  Does have a little more perineal pressure.   PSA was evaluated for possible prostatitis.

## 2024-05-31 NOTE — Progress Notes (Signed)
 Justin Norris - 53 y.o. male MRN [id number]  Date of birth: 1971-01-26  Subjective Chief Complaint  Patient presents with   prostate cancer follow up    Requesting  PSA recheck=    HPI Justin Norris is a 53 year old male here today for follow-up visit.  History of prostate cancer status post brachytherapy.  Continues to have some pressure in the perineal region.  He is seeing urology in [place].  Has follow-up next well.  Has not noticed any trouble voiding, hematuria or dysuria.  ROS:  A comprehensive ROS was completed and negative except as noted per HPI  Allergies  Allergen Reactions   Coconut Fatty Acid Anaphylaxis    Past Medical History:  Diagnosis Date   ANXIETY 07/15/2007   BACK PAIN, RIGHT 07/10/2010   Cancer (HCC)    Headache(784.0) 07/15/2007   History of kidney stones    NEPHROLITHIASIS, HX OF 07/15/2007    Past Surgical History:  Procedure Laterality Date   CYSTOSCOPY N/A 06/01/2023   Procedure: CYSTOSCOPY;  Surgeon: Shona Layman BROCKS, MD;  Location: [facility];  Service: Urology;  Laterality: N/A;   HERNIA REPAIR     LIH   PROSTATE BIOPSY     RADIOACTIVE SEED IMPLANT Bilateral 06/01/2023   Procedure: RADIOACTIVE SEED IMPLANT/BRACHYTHERAPY IMPLANT;  Surgeon: Shona Layman BROCKS, MD;  Location: [facility];  Service: Urology;  Laterality: Bilateral;   SPACE OAR INSTILLATION Bilateral 06/01/2023   Procedure: SPACE OAR INSTILLATION;  Surgeon: Shona Layman BROCKS, MD;  Location: [facility];  Service: Urology;  Laterality: Bilateral;    Social History   Socioeconomic History   Marital status: Married    Spouse name: Not on file   Number of children: 9   Years of education: Not on file   Highest education level: Bachelor's degree (e.g., BA, AB, BS)  Occupational History   Occupation: Environmental Health Practitioner: [employer]  Tobacco Use   Smoking status: Former    Current packs/day: 0.00    Types:  Cigarettes    Start date: 07/03/1994    Quit date: 07/03/2014    Years since quitting: 9.9    Passive exposure: Never   Smokeless tobacco: Never  Vaping Use   Vaping status: Never Used  Substance and Sexual Activity   Alcohol use: No    Alcohol/week: 0.0 standard drinks of alcohol    Comment: rare   Drug use: No   Sexual activity: Yes    Partners: Female  Other Topics Concern   Not on file  Social History Narrative   Not on file   Social Drivers of Health   Financial Resource Strain: Low Risk  (01/07/2024)   Overall Financial Resource Strain (CARDIA)    Difficulty of Paying Living Expenses: Not hard at all  Food Insecurity: No Food Insecurity (01/07/2024)   Hunger Vital Sign    Worried About Running Out of Food in the Last Year: Never true    Ran Out of Food in the Last Year: Never true  Transportation Needs: No Transportation Needs (01/07/2024)   PRAPARE - Administrator, Civil Service (Medical): No    Lack of Transportation (Non-Medical): No  Physical Activity: Sufficiently Active (07/06/2023)   Exercise Vital Sign    Days of Exercise per Week: 5 days    Minutes of Exercise per Session: 30 min  Stress: No Stress Concern Present (01/07/2024)   [employer] of Occupational Health - Occupational Stress  Questionnaire    Feeling of Stress: Only a little  Social Connections: Socially Integrated (01/07/2024)   Social Connection and Isolation Panel    Frequency of Communication with Friends and Family: More than three times a week    Frequency of Social Gatherings with Friends and Family: Three times a week    Attends Religious Services: 1 to 4 times per year    Active Member of Clubs or Organizations: Yes    Attends Engineer, Structural: More than 4 times per year    Marital Status: Married    Family History  Problem Relation Age of Onset   Heart disease Father    Skin cancer Father     Health Maintenance  Topic Date Due   HIV Screening  Never  done   Hepatitis C Screening  Never done   Hepatitis B Vaccines 19-59 Average Risk (1 of 3 - 19+ 3-dose series) Never done   Colonoscopy  Never done   COVID-19 Vaccine (8 - Pfizer risk 2025-26 season) 10/03/2024   DTaP/Tdap/Td (2 - Td or Tdap) 12/06/2032   Pneumococcal Vaccine: 50+ Years  Completed   Influenza Vaccine  Completed   Zoster Vaccines- Shingrix   Completed   HPV VACCINES  Aged Out   Meningococcal B Vaccine  Aged Out     ----------------------------------------------------------------------------------------------------------------------------------------------------------------------------------------------------------------- Physical Exam BP 131/87   Pulse 77   Ht 6' (1.829 m)   Wt 197 lb 12 oz (89.7 kg)   SpO2 98%   BMI 26.82 kg/m   Physical Exam Constitutional:      Appearance: Normal appearance.  Eyes:     General: No scleral icterus. Cardiovascular:     Rate and Rhythm: Normal rate and regular rhythm.  Pulmonary:     Effort: Pulmonary effort is normal.     Breath sounds: Normal breath sounds.  Musculoskeletal:     Cervical back: Neck supple.  Neurological:     Mental Status: He is alert.  Psychiatric:        Mood and Affect: Mood normal.        Behavior: Behavior normal.     ------------------------------------------------------------------------------------------------------------------------------------------------------------------------------------------------------------------- Assessment and Plan  Prostate cancer (HCC); PSA 7.48; GG 1,2; unfavorable intermediate risk (>50% cores +) Status post brachytherapy.  Does have a little more perineal pressure.   PSA was evaluated for possible prostatitis.   No orders of the defined types were placed in this encounter.   No follow-ups on file.

## 2024-06-01 ENCOUNTER — Ambulatory Visit: Payer: Self-pay | Admitting: Family Medicine

## 2024-06-01 LAB — PSA: Prostate Specific Ag, Serum: 2.3 ng/mL (ref 0.0–4.0)

## 2024-07-03 ENCOUNTER — Telehealth: Admitting: Family Medicine

## 2024-07-03 ENCOUNTER — Ambulatory Visit: Payer: Self-pay | Admitting: *Deleted

## 2024-07-03 VITALS — Ht 72.0 in | Wt 197.8 lb

## 2024-07-03 DIAGNOSIS — L03032 Cellulitis of left toe: Secondary | ICD-10-CM | POA: Insufficient documentation

## 2024-07-03 MED ORDER — CEPHALEXIN 500 MG PO CAPS: 500.0000 mg | ORAL_CAPSULE | Freq: Four times a day (QID) | ORAL | 0 refills | Status: AC

## 2024-07-03 NOTE — Telephone Encounter (Signed)
 FYI Only or Action Required?: FYI only for provider: appointment scheduled on 12.15.25.  Patient was last seen in primary care on 05/31/2024 by Alvia Bring, DO.  Called Nurse Triage reporting Wound Infection.  Symptoms began several days ago.  Interventions attempted: OTC medications: neosporin.  Symptoms are: gradually worsening.  Triage Disposition: See Physician Within 24 Hours  Patient/caregiver understands and will follow disposition?: Yes

## 2024-07-03 NOTE — Progress Notes (Signed)
 Justin Norris - 53 y.o. male MRN [id number]  Date of birth: 05-Oct-1970   All issues noted in this document were discussed and addressed.  No physical exam was performed (except for noted visual exam findings with Video Visits).  I discussed the limitations of evaluation and management by telemedicine and the availability of in person appointments. The patient expressed understanding and agreed to proceed.  I connected withNAME@ on 07/03/2024 at  4:10 PM EST by a video enabled telemedicine application and verified that I am speaking with the correct person using two identifiers.  Present at visit: Velma Ku, DO Wadie LELON Moats   Patient Location: Home [address] [place] [address]   Provider location:   [facility]  Chief Complaint  Patient presents with   Nail Problem    HPI  Justin Norris is a 53 y.o. male who presents via audio/video conferencing for a telehealth visit today.  He has complaint of left great toe pain with swelling and drainage.  Appears to be draining yellow puslike material.  Does not think he has an ingrown toe nail.  Has tried Neosporin on it without much improvement.  Denies any recent injury.   ROS:  A comprehensive ROS was completed and negative except as noted per HPI  Past Medical History:  Diagnosis Date   ANXIETY 07/15/2007   BACK PAIN, RIGHT 07/10/2010   Cancer (HCC)    Headache(784.0) 07/15/2007   History of kidney stones    NEPHROLITHIASIS, HX OF 07/15/2007    Past Surgical History:  Procedure Laterality Date   CYSTOSCOPY N/A 06/01/2023   Procedure: CYSTOSCOPY;  Surgeon: Shona Layman BROCKS, MD;  Location: [facility];  Service: Urology;  Laterality: N/A;   HERNIA REPAIR     LIH   PROSTATE BIOPSY     RADIOACTIVE SEED IMPLANT Bilateral 06/01/2023   Procedure: RADIOACTIVE SEED IMPLANT/BRACHYTHERAPY IMPLANT;  Surgeon: Shona Layman BROCKS, MD;  Location: [facility];  Service: Urology;  Laterality: Bilateral;    SPACE OAR INSTILLATION Bilateral 06/01/2023   Procedure: SPACE OAR INSTILLATION;  Surgeon: Shona Layman BROCKS, MD;  Location: [facility];  Service: Urology;  Laterality: Bilateral;    Family History  Problem Relation Age of Onset   Heart disease Father    Skin cancer Father     Social History   Socioeconomic History   Marital status: Married    Spouse name: Not on file   Number of children: 9   Years of education: Not on file   Highest education level: Bachelor's degree (e.g., BA, AB, BS)  Occupational History   Occupation: Environmental Health Practitioner: [employer]  Tobacco Use   Smoking status: Former    Current packs/day: 0.00    Types: Cigarettes    Start date: 07/03/1994    Quit date: 07/03/2014    Years since quitting: 10.0    Passive exposure: Never   Smokeless tobacco: Never  Vaping Use   Vaping status: Never Used  Substance and Sexual Activity   Alcohol use: No    Alcohol/week: 0.0 standard drinks of alcohol    Comment: rare   Drug use: No   Sexual activity: Yes    Partners: Female  Other Topics Concern   Not on file  Social History Narrative   Not on file   Social Drivers of Health   Tobacco Use: High Risk (06/01/2024)   [facility]   Patient History  Smoking Tobacco Use: Some Days    Smokeless Tobacco Use: Never    Passive Exposure: Not on file  Financial Resource Strain: Low Risk (07/03/2024)   Overall Financial Resource Strain (CARDIA)    Difficulty of Paying Living Expenses: Not hard at all  Food Insecurity: No Food Insecurity (07/03/2024)   Epic    Worried About Programme Researcher, Broadcasting/film/video in the Last Year: Never true    Ran Out of Food in the Last Year: Never true  Transportation Needs: No Transportation Needs (07/03/2024)   Epic    Lack of Transportation (Medical): No    Lack of Transportation (Non-Medical): No  Physical Activity: Insufficiently Active (07/03/2024)   Exercise Vital Sign    Days of Exercise  per Week: 3 days    Minutes of Exercise per Session: 30 min  Stress: No Stress Concern Present (07/03/2024)   [employer] of Occupational Health - Occupational Stress Questionnaire    Feeling of Stress: Not at all  Social Connections: Socially Integrated (07/03/2024)   Social Connection and Isolation Panel    Frequency of Communication with Friends and Family: More than three times a week    Frequency of Social Gatherings with Friends and Family: Twice a week    Attends Religious Services: 1 to 4 times per year    Active Member of [employer] or Organizations: Yes    Attends Banker Meetings: 1 to 4 times per year    Marital Status: Married  Catering Manager Violence: Not At Risk (01/07/2024)   Epic    Fear of Current or Ex-Partner: No    Emotionally Abused: No    Physically Abused: No    Sexually Abused: No  Depression (PHQ2-9): Low Risk (01/07/2024)   Depression (PHQ2-9)    PHQ-2 Score: 2  Alcohol Screen: Low Risk (03/10/2023)   Alcohol Screen    Last Alcohol Screening Score (AUDIT): 0  Housing: Unknown (07/03/2024)   Epic    Unable to Pay for Housing in the Last Year: No    Number of Times Moved in the Last Year: 0    Homeless in the Last Year: Not on file  Utilities: Not At Risk (01/07/2024)   Epic    Threatened with loss of utilities: No  Health Literacy: Adequate Health Literacy (01/07/2024)   B1300 Health Literacy    Frequency of need for help with medical instructions: Rarely    Current Medications[1]  EXAM:  VITALS per patient if applicable: Ht 6' (1.829 m)   Wt 197 lb 12 oz (89.7 kg)   BMI 26.82 kg/m   GENERAL: alert, oriented, appears well and in no acute distress  HEENT: atraumatic, conjunttiva clear, no obvious abnormalities on inspection of external nose and ears  NECK: normal movements of the head and neck  LUNGS: on inspection no signs of respiratory distress, breathing rate appears normal, no obvious gross SOB, gasping or wheezing  CV:  no obvious cyanosis  MS: moves all visible extremities without noticeable abnormality  PSYCH/NEURO: pleasant and cooperative, no obvious depression or anxiety, speech and thought processing grossly intact  ASSESSMENT AND PLAN:  Discussed the following assessment and plan:  Paronychia of great toe of left foot Recommend soaking regularly.  Adding course of Keflex .  Red flags reviewed.  Recommend in person evaluation if not improving.     I discussed the assessment and treatment plan with the patient. The patient was provided an opportunity to ask questions and all were answered. The patient agreed with  the plan and demonstrated an understanding of the instructions.   The patient was advised to call back or seek an in-person evaluation if the symptoms worsen or if the condition fails to improve as anticipated.    Velma Ku, DO      [1]  Current Outpatient Medications:    ALPRAZolam  (XANAX ) 0.5 MG tablet, Take 1 tablet (0.5 mg total) by mouth 2 (two) times daily as needed for anxiety., Disp: 45 tablet, Rfl: 1   amLODipine  (NORVASC ) 2.5 MG tablet, Take 1 tablet (2.5 mg total) by mouth daily., Disp: 90 tablet, Rfl: 1   cephALEXin  (KEFLEX ) 500 MG capsule, Take 1 capsule (500 mg total) by mouth 4 (four) times daily for 7 days., Disp: 28 capsule, Rfl: 0   furosemide  (LASIX ) 20 MG tablet, TAKE 1 TABLET (20 MG TOTAL) BY MOUTH DAILY AS NEEDED FOR EDEMA OR FLUID., Disp: 90 tablet, Rfl: 1   tadalafil  (CIALIS ) 20 MG tablet, TAKE 1 TABLET BY MOUTH EVERY DAY AS NEEDED FOR ERECTILE DYSFUNCTION, Disp: 5 tablet, Rfl: 23   tamsulosin  (FLOMAX ) 0.4 MG CAPS capsule, Take 2 capsules (0.8 mg total) by mouth daily., Disp: 90 capsule, Rfl: 3

## 2024-07-03 NOTE — Telephone Encounter (Signed)
°  Reason for Disposition  [1] Looks infected (e.g., spreading redness, pus) AND [2] no fever  [1] Pus or cloudy fluid draining from wound AND [2] no fever  Answer Assessment - Initial Assessment Questions Pt states Friday he started to have some redness and possible pus in the left big toe. He thought it was an ingrown toe nail so tried to dig it out himself but one he peeled back the skin he states it was full of pus. He did dig around and there is no ingrown toenail there. He has been using neosporin. States as soon as there is pressure on the wound it starts to ooze again. He states toe is throbbing and redness on that side of the toe, does not extend past the first knuckle, no fever.   1. LOCATION: Where is the wound located?      Left great toe 2. WOUND APPEARANCE: What does the wound look like?      Redness and pus filled 3. SIZE: If redness is present, ask: What is the size of the red area? (Inches, centimeters, or compare to size of a coin)      Just the side of the toe nail 4. SPREAD: What's changed in the last day?  Do you see any red streaks coming from the wound?     Does not go past the first knuckle 5. ONSET: When did it start to look infected?      Friday 6. MECHANISM: How did the wound start, what was the cause?     unknown 7. PAIN: Do you have any pain?  If Yes, ask: How bad is the pain?  (e.g., Scale 1-10; mild, moderate, or severe)     throbbing 8. FEVER: Do you have a fever? If Yes, ask: What is your temperature, how was it measured, and when did it start?     denies 9. OTHER SYMPTOMS: Do you have any other symptoms? (e.g., shaking chills, weakness, rash elsewhere on body)     denies  Protocols used: Wound Infection Suspected-A-AH

## 2024-07-03 NOTE — Telephone Encounter (Signed)
 Copied from CRM #[id number]. Topic: Clinical - Red Word Triage >> Jul 03, 2024  8:57 AM Tonda B wrote: [facility] that prompted transfer to Nurse Triage:  pt left big toe is infected swollen as well as pain  Caller no longer on line at transfer- attempted to contact patient- no answer- left message to call back regarding symptoms

## 2024-07-03 NOTE — Assessment & Plan Note (Signed)
 Recommend soaking regularly.  Adding course of Keflex .  Red flags reviewed.  Recommend in person evaluation if not improving.

## 2024-07-03 NOTE — Progress Notes (Signed)
 Left great toe infection. Pus: yellow Started oozing on Friday. Tried Neosporin and bandaid.

## 2024-07-04 ENCOUNTER — Other Ambulatory Visit

## 2024-07-05 ENCOUNTER — Other Ambulatory Visit

## 2024-07-05 DIAGNOSIS — C61 Malignant neoplasm of prostate: Secondary | ICD-10-CM

## 2024-07-06 LAB — PSA: Prostate Specific Ag, Serum: 2.7 ng/mL (ref 0.0–4.0)

## 2024-07-07 ENCOUNTER — Encounter: Payer: Self-pay | Admitting: Urology

## 2024-07-07 ENCOUNTER — Ambulatory Visit: Admitting: Urology

## 2024-07-07 VITALS — BP 160/84 | HR 78 | Ht 72.0 in | Wt 196.0 lb

## 2024-07-07 DIAGNOSIS — C61 Malignant neoplasm of prostate: Secondary | ICD-10-CM | POA: Diagnosis not present

## 2024-07-07 DIAGNOSIS — R399 Unspecified symptoms and signs involving the genitourinary system: Secondary | ICD-10-CM

## 2024-07-07 LAB — MICROSCOPIC EXAMINATION
Bacteria, UA: NONE SEEN
RBC, Urine: NONE SEEN /HPF (ref 0–2)

## 2024-07-07 LAB — URINALYSIS, COMPLETE
Bilirubin, UA: NEGATIVE
Glucose, UA: NEGATIVE
Ketones, UA: NEGATIVE
Leukocytes,UA: NEGATIVE
Nitrite, UA: NEGATIVE
Protein,UA: NEGATIVE
RBC, UA: NEGATIVE
Specific Gravity, UA: >1.03 — ABNORMAL HIGH (ref 1.005–1.030)
Urobilinogen, Ur: 0.2 mg/dL (ref 0.2–1.0)
pH, UA: 6 (ref 5.0–7.5)

## 2024-07-07 MED ORDER — TADALAFIL 5 MG PO TABS: 5.0000 mg | ORAL_TABLET | Freq: Every day | ORAL | 3 refills | Status: AC

## 2024-07-07 NOTE — Progress Notes (Signed)
 "  07/07/2024 8:58 AM   Justin Norris 1970/10/18 [id number]  Referring provider: Alvia Bring, DO [facility] [address] [place],  [place] [address]  Chief Complaint  Patient presents with   Prostate Cancer   Urologic history:   1.  Prostate cancer PSA 08/2022-6.76; repeat 11/2022-7.48 PBx 01/13/2023: Volume 27 cc Path: 5/12 Gleason 3+3 (15%, 7%, 50%, 20%, 80%); 3/12 Gleason 3+4 (10%, 10%, 1%); 4/12 benign.  Decipher Genomic Risk: Low NCCN restratification: Unfavorable intermediate (>50% cores +) PSMA/PET negative Treatment: After discussing management options he elected LDR brachytherapy performed 06/01/2023 Dr. Shona   HPI: Justin Norris is a 53 y.o. male presents for prostate cancer follow-up.  Very concerned about recent PSA rise PSA 05/31/2024 was 2.3 which was ordered by PCP; PSA scheduled prior to this appointment on 07/06/2024 was 2.7 Having worsening symptoms of frequency, urgency, mild dysuria, weak urinary stream and feels like his prostate is swollen  PSA trend    Prostate Specific Ag, Serum  Latest Ref Rng 0.0 - 4.0 ng/mL  09/07/2022 6.76  12/07/2022 7.48  03/10/2023 6.5  10/21/2023 5.1  11/19/2023 3.0  01/07/2024 3.1  03/21/2024 2.0  05/31/2024 2.3  07/06/2024 2.7     PMH: Past Medical History:  Diagnosis Date   ANXIETY 07/15/2007   BACK PAIN, RIGHT 07/10/2010   Cancer (HCC)    Headache(784.0) 07/15/2007   History of kidney stones    NEPHROLITHIASIS, HX OF 07/15/2007    Surgical History: Past Surgical History:  Procedure Laterality Date   CYSTOSCOPY N/A 06/01/2023   Procedure: CYSTOSCOPY;  Surgeon: Shona Layman BROCKS, MD;  Location: [facility];  Service: Urology;  Laterality: N/A;   HERNIA REPAIR     LIH   PROSTATE BIOPSY     RADIOACTIVE SEED IMPLANT Bilateral 06/01/2023   Procedure: RADIOACTIVE SEED IMPLANT/BRACHYTHERAPY IMPLANT;  Surgeon: Shona Layman BROCKS, MD;  Location: [facility];  Service: Urology;   Laterality: Bilateral;   SPACE OAR INSTILLATION Bilateral 06/01/2023   Procedure: SPACE OAR INSTILLATION;  Surgeon: Shona Layman BROCKS, MD;  Location: [facility];  Service: Urology;  Laterality: Bilateral;    Home Medications:  Allergies as of 07/07/2024       Reactions   Coconut Fatty Acid Anaphylaxis        Medication List        Accurate as of July 07, 2024  8:58 AM. If you have any questions, ask your nurse or doctor.          ALPRAZolam  0.5 MG tablet Commonly known as: XANAX  Take 1 tablet (0.5 mg total) by mouth 2 (two) times daily as needed for anxiety.   amLODipine  2.5 MG tablet Commonly known as: NORVASC  Take 1 tablet (2.5 mg total) by mouth daily.   cephALEXin  500 MG capsule Commonly known as: KEFLEX  Take 1 capsule (500 mg total) by mouth 4 (four) times daily for 7 days.   furosemide  20 MG tablet Commonly known as: LASIX  TAKE 1 TABLET (20 MG TOTAL) BY MOUTH DAILY AS NEEDED FOR EDEMA OR FLUID.   tadalafil  20 MG tablet Commonly known as: CIALIS  TAKE 1 TABLET BY MOUTH EVERY DAY AS NEEDED FOR ERECTILE DYSFUNCTION   tamsulosin  0.4 MG Caps capsule Commonly known as: FLOMAX  Take 2 capsules (0.8 mg total) by mouth daily.        Allergies: Allergies[1]  Family History: Family History  Problem Relation Age of Onset   Heart disease Father  Skin cancer Father     Social History:  reports that he quit smoking about 10 years ago. His smoking use included cigarettes. He started smoking about 30 years ago. He has never been exposed to tobacco smoke. He has never used smokeless tobacco. He reports that he does not drink alcohol and does not use drugs.   Physical Exam: BP (!) 160/84   Pulse 78   Ht 6' (1.829 m)   Wt 196 lb (88.9 kg)   SpO2 98%   BMI 26.58 kg/m   Constitutional:  Alert, No acute distress. HEENT: [place] AT Respiratory: Normal respiratory effort, no increased work of breathing. Psychiatric: Normal mood and  affect.  Laboratory Data:  Urinalysis Dipstick/microscopy negative   Assessment & Plan:    1. Prostate cancer (HCC) (Primary) Rising PSA Having increased voiding symptoms and we discussed PSA rise may be secondary to prostatic inflammation Recommend PSMA/PET to evaluate for any evidence of recurrent/metastatic disease.  Order placed and will notify with results  2.  Lower urinary tract symptoms History chronic prostatitis Will add tadalafil  5 mg daily He inquired about antibiotic therapy and based on his normal urinalysis we discussed no indication that this is bacterial prostatitis and antibiotics would not be beneficial   Justin JAYSON Barba, MD  [facility] [address] [place], [place] [address] [phone]     [1]  Allergies Allergen Reactions   Coconut Fatty Acid Anaphylaxis   "

## 2024-07-07 NOTE — Patient Instructions (Signed)
 Scheduling number: [phone]

## 2024-07-18 ENCOUNTER — Other Ambulatory Visit

## 2024-07-24 ENCOUNTER — Ambulatory Visit

## 2024-08-01 ENCOUNTER — Encounter: Payer: Self-pay | Admitting: Radiology

## 2024-08-02 ENCOUNTER — Ambulatory Visit

## 2024-08-02 ENCOUNTER — Ambulatory Visit: Admitting: Family Medicine

## 2024-08-02 ENCOUNTER — Encounter: Payer: Self-pay | Admitting: Family Medicine

## 2024-08-02 VITALS — BP 116/66 | HR 65 | Ht 72.0 in | Wt 214.0 lb

## 2024-08-02 DIAGNOSIS — M79672 Pain in left foot: Secondary | ICD-10-CM

## 2024-08-02 DIAGNOSIS — L03039 Cellulitis of unspecified toe: Secondary | ICD-10-CM | POA: Insufficient documentation

## 2024-08-02 DIAGNOSIS — L03032 Cellulitis of left toe: Secondary | ICD-10-CM | POA: Diagnosis not present

## 2024-08-02 DIAGNOSIS — C61 Malignant neoplasm of prostate: Secondary | ICD-10-CM

## 2024-08-02 MED ORDER — METHYLPREDNISOLONE ACETATE 40 MG/ML IJ SUSP
40.0000 mg | Freq: Once | INTRAMUSCULAR | Status: AC
  Administered 2024-08-02: 40 mg via INTRAMUSCULAR

## 2024-08-02 MED ORDER — DOXYCYCLINE HYCLATE 100 MG PO TABS: 100.0000 mg | ORAL_TABLET | Freq: Two times a day (BID) | ORAL | 0 refills | Status: AC

## 2024-08-02 NOTE — Assessment & Plan Note (Signed)
 Followed by urology.  He is seeking second opinion as well from heme-onc at [facility].

## 2024-08-02 NOTE — Assessment & Plan Note (Signed)
 Recently treated with Keflex  without resolution.  Adding course of doxycycline .  He does have some radiation into the MCP joint as well.  X-rays ordered.

## 2024-08-02 NOTE — Assessment & Plan Note (Signed)
 Given home exercise plan.  Injection of Depo-Medrol  today.

## 2024-08-02 NOTE — Progress Notes (Signed)
 " Justin Norris - 54 y.o. male MRN [id number]  Date of birth: [birth year]  Subjective Chief Complaint  Patient presents with   Toe Injury   Sinusitis   Back Pain    HPI Justin Norris is a 54 y.o. male here today for follow up visit.   He has history of prostate cancer s/p brachytherapy.  PSA has been fairly stable.  Had urology visit in December and PSA had increased some..  Repeat PET scan recommended.  He as reached out to [facility] for 2nd opinion on his treatment.  He continues to feel like his prostate is swollen.  He is having pain the L great toe.  He feels virtually a few weeks ago started on Keflex .  Has not really had a whole lot of improvement with Keflex .  Denies fever or chills.  He is having some low back pain.  This is a chronic issue that flares up intermittently.  Denies radiation of pain, numbness or tingling.  Depo-Medrol  injection has helped in the past.  ROS:  A comprehensive ROS was completed and negative except as noted per HPI  Allergies[1]  Past Medical History:  Diagnosis Date   ANXIETY 07/15/2007   BACK PAIN, RIGHT 07/10/2010   Cancer (HCC)    Headache(784.0) 07/15/2007   History of kidney stones    NEPHROLITHIASIS, HX OF 07/15/2007    Past Surgical History:  Procedure Laterality Date   CYSTOSCOPY N/A 06/01/2023   Procedure: CYSTOSCOPY;  Surgeon: Shona Layman BROCKS, MD;  Location: [facility];  Service: Urology;  Laterality: N/A;   HERNIA REPAIR     LIH   PROSTATE BIOPSY     RADIOACTIVE SEED IMPLANT Bilateral 06/01/2023   Procedure: RADIOACTIVE SEED IMPLANT/BRACHYTHERAPY IMPLANT;  Surgeon: Shona Layman BROCKS, MD;  Location: [facility];  Service: Urology;  Laterality: Bilateral;   SPACE OAR INSTILLATION Bilateral 06/01/2023   Procedure: SPACE OAR INSTILLATION;  Surgeon: Shona Layman BROCKS, MD;  Location: [facility];  Service: Urology;  Laterality: Bilateral;    Social History   Socioeconomic  History   Marital status: Married    Spouse name: Not on file   Number of children: 9   Years of education: Not on file   Highest education level: Bachelor's degree (e.g., BA, AB, BS)  Occupational History   Occupation: Environmental Health Practitioner: [employer]  Tobacco Use   Smoking status: Former    Current packs/day: 0.00    Types: Cigarettes    Start date: 07/03/1994    Quit date: 07/03/2014    Years since quitting: 10.0    Passive exposure: Never   Smokeless tobacco: Never  Vaping Use   Vaping status: Never Used  Substance and Sexual Activity   Alcohol use: No    Alcohol/week: 0.0 standard drinks of alcohol    Comment: rare   Drug use: No   Sexual activity: Yes    Partners: Female  Other Topics Concern   Not on file  Social History Narrative   Not on file   Social Drivers of Health   Tobacco Use: Medium Risk (08/02/2024)   Patient History    Smoking Tobacco Use: Former    Smokeless Tobacco Use: Never    Passive Exposure: Never  Physicist, Medical Strain: Low Risk (07/03/2024)   Overall Financial Resource Strain (CARDIA)    Difficulty of Paying Living Expenses: Not hard at all  Food Insecurity: No Food Insecurity (07/03/2024)   Epic  Worried About Programme Researcher, Broadcasting/film/video in the Last Year: Never true    Ran Out of Food in the Last Year: Never true  Transportation Needs: No Transportation Needs (07/03/2024)   Epic    Lack of Transportation (Medical): No    Lack of Transportation (Non-Medical): No  Physical Activity: Insufficiently Active (07/03/2024)   Exercise Vital Sign    Days of Exercise per Week: 3 days    Minutes of Exercise per Session: 30 min  Stress: No Stress Concern Present (07/03/2024)   Justin Norris [employer] - Occupational Stress Questionnaire    Feeling of Stress: Not at all  Social Connections: Socially Integrated (07/03/2024)   Social Connection and Isolation Panel    Frequency of Communication with Friends and Family:  More than three times a week    Frequency of Social Gatherings with Friends and Family: Twice a week    Attends Religious Services: 1 to 4 times per year    Active Member of Clubs or Organizations: Yes    Attends Banker Meetings: 1 to 4 times per year    Marital Status: Married  Depression (PHQ2-9): Low Risk (01/07/2024)   Depression (PHQ2-9)    PHQ-2 Score: 2  Alcohol Screen: Low Risk (03/10/2023)   Alcohol Screen    Last Alcohol Screening Score (AUDIT): 0  Housing: Unknown (07/03/2024)   Epic    Unable to Pay for Housing in the Last Year: No    Number of Times Moved in the Last Year: 0    Homeless in the Last Year: Not on file  Utilities: Not At Risk (01/07/2024)   Epic    Threatened with loss of utilities: No  Health Literacy: Adequate Health Literacy (01/07/2024)   B1300 Health Literacy    Frequency of need for help with medical instructions: Rarely    Family History  Problem Relation Age of Onset   Heart disease Father    Skin cancer Father     Health Maintenance  Topic Date Due   HIV Screening  Never done   Hepatitis C Screening  Never done   Hepatitis B Vaccines 19-59 Average Risk (1 of 3 - 19+ 3-dose series) Never done   Colonoscopy  Never done   COVID-19 Vaccine (8 - Pfizer risk 2025-26 season) 10/03/2024   DTaP/Tdap/Td (2 - Td or Tdap) 12/06/2032   Pneumococcal Vaccine: 50+ Years  Completed   Influenza Vaccine  Completed   Zoster Vaccines- Shingrix   Completed   HPV VACCINES  Aged Out   Meningococcal B Vaccine  Aged Out     ----------------------------------------------------------------------------------------------------------------------------------------------------------------------------------------------------------------- Physical Exam BP 116/66 (BP Location: Left Arm, Patient Position: Sitting)   Pulse 65   Ht 6' (1.829 m)   Wt 214 lb (97.1 kg)   SpO2 95%   BMI 29.02 kg/m   Physical Exam Constitutional:      Appearance: Normal  appearance.  HENT:     Head: Normocephalic and atraumatic.  Eyes:     General: No scleral icterus. Cardiovascular:     Rate and Rhythm: Normal rate and regular rhythm.  Pulmonary:     Effort: Pulmonary effort is normal.     Breath sounds: Normal breath sounds.  Musculoskeletal:     Cervical back: Neck supple.     Comments: Left great toe is erythematous with scabbing over lateral nail fold.  No purulent drainage.  Neurological:     Mental Status: He is alert.  Psychiatric:  Mood and Affect: Mood normal.        Behavior: Behavior normal.     ------------------------------------------------------------------------------------------------------------------------------------------------------------------------------------------------------------------- Assessment and Plan  Prostate cancer (HCC); PSA 7.48; GG 1,2; unfavorable intermediate risk (>50% cores +) Followed by urology.  He is seeking second opinion as well from heme-onc at [facility].  Cellulitis of toe Recently treated with Keflex  without resolution.  Adding course of doxycycline .  He does have some radiation into the MCP joint as well.  X-rays ordered.  Low back pain Given home exercise plan.  Injection of Depo-Medrol  today.     Meds ordered this encounter  Medications   doxycycline  (VIBRA -TABS) 100 MG tablet    Sig: Take 1 tablet (100 mg total) by mouth 2 (two) times daily for 14 days.    Dispense:  28 tablet    Refill:  0   methylPREDNISolone  acetate (DEPO-MEDROL ) injection 40 mg    No follow-ups on file.         [1]  Allergies Allergen Reactions   Coconut Fatty Acid Anaphylaxis   "

## 2024-08-04 ENCOUNTER — Ambulatory Visit: Payer: Self-pay | Admitting: Family Medicine

## 2024-08-16 ENCOUNTER — Telehealth: Payer: Self-pay | Admitting: Urology

## 2024-08-16 ENCOUNTER — Ambulatory Visit

## 2024-08-16 NOTE — Telephone Encounter (Signed)
 Cindy from [facility] in [place] called on 1/27 regarding PET scan orders so the patient could have the scan performed there. After receiving the faxed medical release form, it was noted that the authorization was for [facility], not our facility. She stated that the patients insurance would not authorize the scan because an order was already on file under Dr. Tex name.  The patient was seen here on 07/07/24, at which time Dr. Twylla did order a PET scan; however, when scheduling contacted the patient to arrange the appointment, he declined. I relayed this information to [place], and she stated she would follow up with the insurance company and take care of the issue.
